# Patient Record
Sex: Male | Born: 1970 | Race: White | Hispanic: No | Marital: Married | Smoking: Never smoker
Health system: Southern US, Community
[De-identification: ages and names within clinical notes are randomized; demographics above are authoritative.]

## PROBLEM LIST (undated history)

## (undated) DIAGNOSIS — J45909 Unspecified asthma, uncomplicated: Secondary | ICD-10-CM

## (undated) DIAGNOSIS — K760 Fatty (change of) liver, not elsewhere classified: Secondary | ICD-10-CM

## (undated) DIAGNOSIS — R519 Headache, unspecified: Secondary | ICD-10-CM

## (undated) DIAGNOSIS — T7840XA Allergy, unspecified, initial encounter: Secondary | ICD-10-CM

## (undated) DIAGNOSIS — I10 Essential (primary) hypertension: Secondary | ICD-10-CM

## (undated) DIAGNOSIS — E785 Hyperlipidemia, unspecified: Secondary | ICD-10-CM

## (undated) DIAGNOSIS — R51 Headache: Secondary | ICD-10-CM

## (undated) HISTORY — DX: Allergy, unspecified, initial encounter: T78.40XA

## (undated) HISTORY — DX: Hyperlipidemia, unspecified: E78.5

## (undated) HISTORY — PX: HERNIA REPAIR: SHX51

---

## 2000-07-29 ENCOUNTER — Encounter: Payer: Self-pay | Admitting: Family Medicine

## 2001-06-20 ENCOUNTER — Emergency Department (HOSPITAL_COMMUNITY): Admission: EM | Admit: 2001-06-20 | Discharge: 2001-06-20 | Payer: Self-pay

## 2004-06-18 ENCOUNTER — Ambulatory Visit: Payer: Self-pay | Admitting: Family Medicine

## 2004-07-24 ENCOUNTER — Ambulatory Visit: Payer: Self-pay | Admitting: Family Medicine

## 2004-07-24 LAB — CONVERTED CEMR LAB: TSH: 1.98 microintl units/mL

## 2004-07-29 ENCOUNTER — Ambulatory Visit: Payer: Self-pay | Admitting: Family Medicine

## 2004-08-13 ENCOUNTER — Ambulatory Visit: Payer: Self-pay | Admitting: Family Medicine

## 2004-08-15 ENCOUNTER — Ambulatory Visit: Payer: Self-pay | Admitting: Orthopedic Surgery

## 2004-08-28 ENCOUNTER — Ambulatory Visit: Payer: Self-pay | Admitting: Family Medicine

## 2004-11-28 ENCOUNTER — Ambulatory Visit: Payer: Self-pay | Admitting: Family Medicine

## 2004-12-26 ENCOUNTER — Ambulatory Visit: Payer: Self-pay | Admitting: Family Medicine

## 2005-01-01 ENCOUNTER — Ambulatory Visit: Payer: Self-pay | Admitting: Family Medicine

## 2005-01-10 ENCOUNTER — Ambulatory Visit: Payer: Self-pay | Admitting: Family Medicine

## 2005-02-24 ENCOUNTER — Ambulatory Visit: Payer: Self-pay | Admitting: Family Medicine

## 2005-06-17 ENCOUNTER — Ambulatory Visit: Payer: Self-pay | Admitting: Family Medicine

## 2005-07-08 ENCOUNTER — Ambulatory Visit: Payer: Self-pay | Admitting: Family Medicine

## 2005-12-04 ENCOUNTER — Ambulatory Visit: Payer: Self-pay | Admitting: Family Medicine

## 2006-08-12 ENCOUNTER — Ambulatory Visit: Payer: Self-pay | Admitting: Family Medicine

## 2006-09-01 ENCOUNTER — Ambulatory Visit: Payer: Self-pay | Admitting: Family Medicine

## 2006-09-28 ENCOUNTER — Ambulatory Visit: Payer: Self-pay | Admitting: Family Medicine

## 2007-01-20 ENCOUNTER — Telehealth (INDEPENDENT_AMBULATORY_CARE_PROVIDER_SITE_OTHER): Payer: Self-pay | Admitting: *Deleted

## 2007-07-20 ENCOUNTER — Ambulatory Visit: Payer: Self-pay | Admitting: Family Medicine

## 2007-08-02 ENCOUNTER — Encounter: Payer: Self-pay | Admitting: Family Medicine

## 2007-08-02 DIAGNOSIS — R740 Nonspecific elevation of levels of transaminase and lactic acid dehydrogenase [LDH]: Secondary | ICD-10-CM

## 2007-08-02 DIAGNOSIS — J309 Allergic rhinitis, unspecified: Secondary | ICD-10-CM

## 2007-08-02 DIAGNOSIS — E781 Pure hyperglyceridemia: Secondary | ICD-10-CM

## 2007-08-19 ENCOUNTER — Ambulatory Visit: Payer: Self-pay | Admitting: Family Medicine

## 2008-02-10 ENCOUNTER — Ambulatory Visit: Payer: Self-pay | Admitting: Family Medicine

## 2008-04-21 ENCOUNTER — Telehealth: Payer: Self-pay | Admitting: Family Medicine

## 2008-06-19 ENCOUNTER — Ambulatory Visit: Payer: Self-pay | Admitting: Family Medicine

## 2008-06-19 LAB — CONVERTED CEMR LAB
AST: 44 units/L — ABNORMAL HIGH (ref 0–37)
Albumin: 3.9 g/dL (ref 3.5–5.2)
Alkaline Phosphatase: 94 units/L (ref 39–117)
BUN: 10 mg/dL (ref 6–23)
CO2: 32 meq/L (ref 19–32)
Chloride: 104 meq/L (ref 96–112)
Eosinophils Relative: 0.9 % (ref 0.0–5.0)
Glucose, Bld: 130 mg/dL — ABNORMAL HIGH (ref 70–99)
HDL: 25.3 mg/dL — ABNORMAL LOW (ref >39.0)
Monocytes Relative: 6.7 % (ref 3.0–12.0)
Neutrophils Relative %: 68.9 % (ref 43.0–77.0)
Platelets: 184 10*3/uL (ref 150–400)
Potassium: 3.7 meq/L (ref 3.5–5.1)
Total CHOL/HDL Ratio: 7.1
Total Protein: 6.8 g/dL (ref 6.0–8.3)
VLDL: 33 mg/dL (ref 0–40)
WBC: 10.8 10*3/uL — ABNORMAL HIGH (ref 4.5–10.5)

## 2008-06-26 ENCOUNTER — Ambulatory Visit: Payer: Self-pay | Admitting: Family Medicine

## 2008-06-26 DIAGNOSIS — E119 Type 2 diabetes mellitus without complications: Secondary | ICD-10-CM

## 2008-07-03 ENCOUNTER — Ambulatory Visit: Payer: Self-pay | Admitting: Family Medicine

## 2008-07-19 ENCOUNTER — Ambulatory Visit: Payer: Self-pay | Admitting: Family Medicine

## 2008-07-19 LAB — CONVERTED CEMR LAB: Glucose, Bld: 120 mg/dL — ABNORMAL HIGH (ref 70–99)

## 2008-07-24 ENCOUNTER — Ambulatory Visit: Payer: Self-pay | Admitting: Family Medicine

## 2008-08-03 ENCOUNTER — Telehealth: Payer: Self-pay | Admitting: Family Medicine

## 2008-08-21 ENCOUNTER — Ambulatory Visit: Payer: Self-pay | Admitting: Family Medicine

## 2008-08-23 ENCOUNTER — Encounter: Payer: Self-pay | Admitting: Family Medicine

## 2008-08-24 ENCOUNTER — Ambulatory Visit: Payer: Self-pay | Admitting: Family Medicine

## 2008-11-28 ENCOUNTER — Ambulatory Visit: Payer: Self-pay | Admitting: Family Medicine

## 2008-11-30 ENCOUNTER — Ambulatory Visit: Payer: Self-pay | Admitting: Family Medicine

## 2008-12-05 ENCOUNTER — Telehealth: Payer: Self-pay | Admitting: Family Medicine

## 2008-12-05 ENCOUNTER — Ambulatory Visit: Payer: Self-pay | Admitting: Family Medicine

## 2008-12-07 ENCOUNTER — Ambulatory Visit: Payer: Self-pay | Admitting: Family Medicine

## 2009-01-12 ENCOUNTER — Encounter: Payer: Self-pay | Admitting: Family Medicine

## 2009-02-28 ENCOUNTER — Ambulatory Visit: Payer: Self-pay | Admitting: Family Medicine

## 2009-03-05 ENCOUNTER — Ambulatory Visit: Payer: Self-pay | Admitting: Family Medicine

## 2009-05-02 ENCOUNTER — Ambulatory Visit: Payer: Self-pay | Admitting: Family Medicine

## 2009-05-28 ENCOUNTER — Ambulatory Visit: Payer: Self-pay | Admitting: Family Medicine

## 2009-05-28 ENCOUNTER — Encounter: Admission: RE | Admit: 2009-05-28 | Discharge: 2009-05-28 | Payer: Self-pay | Admitting: Family Medicine

## 2009-05-28 DIAGNOSIS — M25579 Pain in unspecified ankle and joints of unspecified foot: Secondary | ICD-10-CM | POA: Insufficient documentation

## 2009-05-29 ENCOUNTER — Telehealth: Payer: Self-pay | Admitting: Family Medicine

## 2009-06-20 ENCOUNTER — Ambulatory Visit: Payer: Self-pay | Admitting: Family Medicine

## 2009-06-20 DIAGNOSIS — S92253A Displaced fracture of navicular [scaphoid] of unspecified foot, initial encounter for closed fracture: Secondary | ICD-10-CM | POA: Insufficient documentation

## 2009-06-26 ENCOUNTER — Ambulatory Visit: Payer: Self-pay | Admitting: Family Medicine

## 2009-06-26 LAB — CONVERTED CEMR LAB
AST: 28 units/L (ref 0–37)
Albumin: 4 g/dL (ref 3.5–5.2)
Alkaline Phosphatase: 79 units/L (ref 39–117)
Basophils Relative: 0.4 % (ref 0.0–3.0)
CO2: 30 meq/L (ref 19–32)
Creatinine,U: 170.4 mg/dL
Eosinophils Relative: 1.8 % (ref 0.0–5.0)
GFR calc non Af Amer: 72.09 mL/min (ref >60)
Glucose, Bld: 103 mg/dL — ABNORMAL HIGH (ref 70–99)
Hgb A1c MFr Bld: 6.1 % (ref 4.6–6.5)
Lymphocytes Relative: 32.1 % (ref 12.0–46.0)
MCV: 90.4 fL (ref 78.0–100.0)
Microalb Creat Ratio: 0.6 mg/g (ref 0.0–30.0)
Microalb, Ur: 0.1 mg/dL (ref 0.0–1.9)
Monocytes Relative: 9.1 % (ref 3.0–12.0)
Neutrophils Relative %: 56.6 % (ref 43.0–77.0)
Potassium: 3.8 meq/L (ref 3.5–5.1)
RBC: 4.84 M/uL (ref 4.22–5.81)
Sodium: 142 meq/L (ref 135–145)
Total CHOL/HDL Ratio: 6
VLDL: 29 mg/dL (ref 0.0–40.0)
WBC: 8 10*3/uL (ref 4.5–10.5)

## 2009-07-09 ENCOUNTER — Ambulatory Visit: Payer: Self-pay | Admitting: Family Medicine

## 2009-08-20 ENCOUNTER — Ambulatory Visit: Payer: Self-pay | Admitting: Family Medicine

## 2009-08-20 LAB — CONVERTED CEMR LAB: ALT: 29 units/L (ref 0–53)

## 2009-09-28 ENCOUNTER — Ambulatory Visit: Payer: Self-pay | Admitting: Family Medicine

## 2009-09-28 DIAGNOSIS — J018 Other acute sinusitis: Secondary | ICD-10-CM

## 2009-10-04 ENCOUNTER — Ambulatory Visit: Payer: Self-pay | Admitting: Family Medicine

## 2009-10-04 LAB — CONVERTED CEMR LAB
ALT: 25 units/L (ref 0–53)
Total CHOL/HDL Ratio: 4

## 2009-10-10 ENCOUNTER — Ambulatory Visit: Payer: Self-pay | Admitting: Family Medicine

## 2009-11-27 ENCOUNTER — Encounter (INDEPENDENT_AMBULATORY_CARE_PROVIDER_SITE_OTHER): Payer: Self-pay | Admitting: *Deleted

## 2010-02-12 ENCOUNTER — Encounter (INDEPENDENT_AMBULATORY_CARE_PROVIDER_SITE_OTHER): Payer: Self-pay | Admitting: *Deleted

## 2010-05-03 ENCOUNTER — Encounter: Payer: Self-pay | Admitting: Family Medicine

## 2010-05-22 ENCOUNTER — Ambulatory Visit: Payer: Self-pay | Admitting: Family Medicine

## 2010-07-15 ENCOUNTER — Ambulatory Visit: Admit: 2010-07-15 | Payer: Self-pay | Admitting: Family Medicine

## 2010-07-16 ENCOUNTER — Ambulatory Visit
Admission: RE | Admit: 2010-07-16 | Discharge: 2010-07-16 | Payer: Self-pay | Source: Home / Self Care | Attending: Family Medicine | Admitting: Family Medicine

## 2010-08-06 NOTE — Assessment & Plan Note (Signed)
Summary: ST,CONGESTION,COUGH,EAR/CLE   Vital Signs:  Patient profile:   40 year old male Height:      67 inches Weight:      217.50 pounds BMI:     34.19 Temp:     98 degrees F oral Pulse rate:   84 / minute Pulse rhythm:   regular BP sitting:   102 / 72  (left arm) Cuff size:   large  Vitals Entered By: Delilah Shan CMA Duncan Dull) (September 28, 2009 10:41 AM) CC: ST, congestion, cough   History of Present Illness: 40 yo with 1 week of worsening URI symptoms. Started with runnny nose, sinus pressure. Now has producitive cough. NO wheezing or shortness of breath. Throat is sore and ears are popping. No fevers or chills. Taking Mucinex with mild relief of symptoms.  Current Medications (verified): 1)  Allegra 180 Mg  Tabs (Fexofenadine Hcl) .... One Tab By Mouth Once Daily As Needed 2)  Flonase 50 Mcg/act  Susp (Fluticasone Propionate) .... One Inhalation Each Nostril Once Daily As Needed 3)  Vitamin C .... Daily 4)  Metformin Hcl 500 Mg Tabs (Metformin Hcl) .... One Tab By Mouth At [facility] 5)  Pravachol 20 Mg Tabs (Pravastatin Sodium) .... One Tab By Mouth At Night. 6)  Azithromycin 250 Mg  Tabs (Azithromycin) .... 2 By  Mouth Today and Then 1 Daily For 4 Days  Allergies: 1)  ! Pcn  Review of Systems      See HPI General:  Denies chills and fever. ENT:  Complains of earache, nasal congestion, sinus pressure, and sore throat; denies ear discharge. Resp:  Complains of cough and sputum productive; denies shortness of breath and wheezing.  Physical Exam  General:  Well-developed,well-nourished,in no acute distress; alert,appropriate and cooperative throughout examination Ears:  TMs retracted bilaterally. Nose:  nasal dischargemucosal pallor.   Mouth:  pharyngeal erythema.  no exudates. Lungs:  Normal respiratory effort, chest expands symmetrically. Lungs are clear to auscultation, no crackles or wheezes. Heart:  Normal rate and regular rhythm. S1 and S2 normal without gallop,  murmur, click, rub or other extra sounds. Extremities:  No clubbing, cyanosis, edema, or deformity noted with normal full range of motion of all joints.   Psych:  Cognition and judgment appear intact. Alert and cooperative with normal attention span and concentration. No apparent delusions, illusions, hallucinations   Impression & Recommendations:  Problem # 1:  OTHER ACUTE SINUSITIS (ICD-461.8) Assessment New Given duration of symptoms along with progression, will treat with zpack (allergic to PCN). Continue supportive care with Ibuprofen and Mucinex. His updated medication list for this problem includes:    Flonase 50 Mcg/act Susp (Fluticasone propionate) ..... One inhalation each nostril once daily as needed    Azithromycin 250 Mg Tabs (Azithromycin) .Marland Kitchen... 2 by  mouth today and then 1 daily for 4 days  Complete Medication List: 1)  Allegra 180 Mg Tabs (Fexofenadine hcl) .... One tab by mouth once daily as needed 2)  Flonase 50 Mcg/act Susp (Fluticasone propionate) .... One inhalation each nostril once daily as needed 3)  Vitamin C  .... Daily 4)  Metformin Hcl 500 Mg Tabs (Metformin hcl) .... One tab by mouth at nite 5)  Pravachol 20 Mg Tabs (Pravastatin sodium) .... One tab by mouth at night. 6)  Azithromycin 250 Mg Tabs (Azithromycin) .... 2 by  mouth today and then 1 daily for 4 days Prescriptions: AZITHROMYCIN 250 MG  TABS (AZITHROMYCIN) 2 by  mouth today and then 1 daily for 4  days  #6 x 0   Entered and Authorized by:   Ruthe Mannan MD   Signed by:   Ruthe Mannan MD on 09/28/2009   Method used:   [facility]        [facility]  [address] (retail)       [address]       [place], [place]  [address]       Ph: [phone] or [phone]       Fax: [phone]   RxID:   [id number]   Current Allergies (reviewed today): ! PCN

## 2010-08-06 NOTE — Letter (Signed)
Summary: Nadara Eaton letter  [place] at [facility]  [address] [place], [place] [address]   Phone: [phone]  Fax: [phone]       02/12/2010 MRN: [id number]  ELZA SORTOR [address] [place], [place]  [address]  Dear Mr. [facility] [place] and [place] announce the retirement of Arta Silence, M.D., from full-time practice at the [facility] office effective January 03, 2010 and his plans of returning part-time.  It is important to Dr. Hetty Ely and to our practice that you understand that [facility] has seven physicians in our office for your health care needs.  We will continue to offer the same exceptional care that you have today.    Dr. Hetty Ely has spoken to many of you about his plans for retirement and returning part-time in the fall.   We will continue to work with you through the transition to schedule appointments for you in the office and meet the high standards that [place] is committed to.   Again, it is with great pleasure that we share the news that Dr. Hetty Ely will return to [facility] at [facility] in October of 2011 with a reduced schedule.    If you have any questions, or would like to request an appointment with one of our physicians, please call us at [phone] and press the option for Scheduling an appointment.  We take pleasure in providing you with excellent patient care and look forward to seeing you at your next office visit.  Our [facility] Physicians are:  Tillman Abide, M.D. Laurita Quint, M.D. Roxy Manns, M.D. Kerby Nora, M.D. Hannah Beat, M.D. Ruthe Mannan, M.D. We proudly welcomed Raechel Ache, M.D. and Eustaquio Boyden, M.D. to the practice in July/August 2011.  Sincerely,  [facility]

## 2010-08-06 NOTE — Assessment & Plan Note (Signed)
Summary: cold symptoms/alc   Vital Signs:  Patient profile:   40 year old male Weight:      225 pounds Temp:     97.1 degrees F oral Pulse rate:   68 / minute Pulse rhythm:   regular BP sitting:   110 / 74  (left arm) Cuff size:   large  Vitals Entered By: Sydell Axon LPN (May 22, 2010 11:21 AM) CC: Cold symptoms, head congestion/green to clear and cough X 2 weeks   History of Present Illness: Pt here for congestive sxs. His sugar control has been good. He has not seen a bump in his sugar with this episode of congestion. He has had no headache, he had fever the third day of 99.2 which has resolved, some ear pressure bilat, copiuos rhinitis green in the Am which clears, ST which he continues to have, some cough worse at night, nonproductive, no nausea or vomiting.  He has taken guaifenesin AM and lunch.  Problems Prior to Update: 1)  Other Acute Sinusitis  (ICD-461.8) 2)  Closed Fracture of Navicular Bone of Foot  (ICD-825.22) 3)  Ankle Pain, Left  (ICD-719.47) 4)  Herpes Zoster, L Supraperiorbital Area  (ICD-053.9) 5)  Diabetes Mellitus  (ICD-250.00) 6)  Health Maintenance Exam  (ICD-V70.0) 7)  Allergic Rhinitis  (ICD-477.9) 8)  Hypertriglyceridemia  (ICD-272.1) 9)  Nonspec Elevation of Levels of Transaminase/ldh  (ICD-790.4)  Medications Prior to Update: 1)  Allegra 180 Mg  Tabs (Fexofenadine Hcl) .... One Tab By Mouth Once Daily As Needed 2)  Flonase 50 Mcg/act  Susp (Fluticasone Propionate) .... One Inhalation Each Nostril Once Daily As Needed 3)  Vitamin C .... Daily 4)  Metformin Hcl 500 Mg Tabs (Metformin Hcl) .... One Tab By Mouth At [facility] 5)  Pravachol 20 Mg Tabs (Pravastatin Sodium) .... 1/2  Tab By Mouth At Night. 6)  Onetouch Test  Strp (Glucose Blood) .... Check Blood Sugar Once A Day 7)  Onetouch Lancets  Misc (Lancets) .... Check Blood Sugar Once A Day  Current Medications (verified): 1)  Allegra 180 Mg  Tabs (Fexofenadine Hcl) .... One Tab By Mouth Once  Daily As Needed 2)  Flonase 50 Mcg/act  Susp (Fluticasone Propionate) .... One Inhalation Each Nostril Once Daily As Needed 3)  Metformin Hcl 500 Mg Tabs (Metformin Hcl) .... One Tab By Mouth At [facility] 4)  Pravachol 20 Mg Tabs (Pravastatin Sodium) .... 1/2  Tab By Mouth At Night. 5)  Onetouch Test  Strp (Glucose Blood) .... Check Blood Sugar Once A Day 6)  Onetouch Lancets  Misc (Lancets) .... Check Blood Sugar Once A Day 7)  Vitamin C 500 Mg Tabs (Ascorbic Acid) .... Take One By Mouth Daily 8)  Ery-Tab 333 Mg Tbec (Erythromycin Base) .... One Tab By Mouth Three Times A Day  Allergies: 1)  ! Pcn  Physical Exam  General:  Well-developed,well-nourished,in no acute distress; alert,appropriate and cooperative throughout examination, mildly overweigh, minimally congested. Head:  Normocephalic and atraumatic without obvious abnormalities. No apparent alopecia or balding. Sinuses NT. Eyes:  Conjunctiva clear bilaterally.  Ears:  External ear exam shows no significant lesions or deformities.  Otoscopic examination reveals clear canals, tympanic membranes are intact bilaterally without bulging, retraction, inflammation or discharge. Hearing is grossly normal bilaterally. Nose:  No nasal discharge, mild mucosal pallor.   Mouth:  pharyngeal erythema.  no exudates, mild PND. Neck:  No deformities, masses, or tenderness noted. Lungs:  Normal respiratory effort, chest expands symmetrically. Lungs with slight crackles  in the right lung base which does not clear with cough. Heart:  Normal rate and regular rhythm. S1 and S2 normal without gallop, murmur, click, rub or other extra sounds.   Impression & Recommendations:  Problem # 1:  BRONCHITIS- ACUTE (ICD-466.0) Assessment New See instructions. His updated medication list for this problem includes:    Ery-tab 333 Mg Tbec (Erythromycin base) ..... One tab by mouth three times a day  Complete Medication List: 1)  Allegra 180 Mg Tabs (Fexofenadine hcl)  .... One tab by mouth once daily as needed 2)  Flonase 50 Mcg/act Susp (Fluticasone propionate) .... One inhalation each nostril once daily as needed 3)  Metformin Hcl 500 Mg Tabs (Metformin hcl) .... One tab by mouth at nite 4)  Pravachol 20 Mg Tabs (Pravastatin sodium) .... 1/2  tab by mouth at night. 5)  Onetouch Test Strp (Glucose blood) .... Check blood sugar once a day 6)  Onetouch Lancets Misc (Lancets) .... Check blood sugar once a day 7)  Vitamin C 500 Mg Tabs (Ascorbic acid) .... Take one by mouth daily 8)  Ery-tab 333 Mg Tbec (Erythromycin base) .... One tab by mouth three times a day  Patient Instructions: 1)  Take Emycin. 2)  Cont Guaif. 3)  Use Delsym at nite per label.  4)  Call if no impr or worsenining. Prescriptions: ERY-TAB 333 MG TBEC (ERYTHROMYCIN BASE) one tab by mouth three times a day  #30 x 0   Entered and Authorized by:   Shaune Leeks MD   Signed by:   Shaune Leeks MD on 05/22/2010   Method used:   [facility]        [facility]  [id number] ([facility]       [address] [place], [place]  [address]       Ph: [phone] or [phone]       Fax: [phone]   RxID:   [id number]    Orders Added: 1)  Est. Patient Level III [[address]]    Current Allergies (reviewed today): ! PCN

## 2010-08-06 NOTE — Consult Note (Signed)
Summary: Dr.William Porfilio,[facility],Note  Dr.William Porfilio,[facility],Note   Imported By: Beau Fanny 05/08/2010 16:50:23  _____________________________________________________________________  External Attachment:    Type:   Image     Comment:   External Document  Appended Document: Dr.William Porfilio,[facility],Note     Clinical Lists Changes  Observations: Added new observation of DMEYEEXAMNXT: 05/2011 (05/08/2010 17:45) Added new observation of DMEYEEXMRES: normal (05/03/2010 17:45) Added new observation of EYE EXAM BY: Dr Druscilla Brownie (05/03/2010 17:45) Added new observation of DIAB EYE EX: normal (05/03/2010 17:45)        Diabetes Management Exam:    Eye Exam:       Eye Exam done elsewhere          Date: 05/03/2010          Results: normal          Done by: Dr Druscilla Brownie

## 2010-08-06 NOTE — Assessment & Plan Note (Signed)
Summary: 3 month follow up/rbh   Vital Signs:  Patient profile:   40 year old male Weight:      219.50 pounds Temp:     98.3 degrees F oral Pulse rate:   64 / minute Pulse rhythm:   regular BP sitting:   108 / 70  (left arm) Cuff size:   large  Vitals Entered By: Sydell Axon LPN (October 10, 1608 11:58 AM) CC: 3 Month follow-up   History of Present Illness: Pt here for followup asfter starting on Pravachol as diabetic for plaque stabilization with chol profile that was not that bad, LDL 91, Trig 145 but HDL 25. He continues to watch his intake and check his sugar carefully. He checks in a three day progression, nos typically 90=-100s, nighttime sometimes 140-160 but he then relates he realizes sometimes those are done within 2 hrs or eating supper due to getting home late and going to bed early! He has no complaints and feels well.  Problems Prior to Update: 1)  Other Acute Sinusitis  (ICD-461.8) 2)  Closed Fracture of Navicular Bone of Foot  (ICD-825.22) 3)  Ankle Pain, Left  (ICD-719.47) 4)  Herpes Zoster, L Supraperiorbital Area  (ICD-053.9) 5)  Diabetes Mellitus  (ICD-250.00) 6)  Health Maintenance Exam  (ICD-V70.0) 7)  Allergic Rhinitis  (ICD-477.9) 8)  Hypertriglyceridemia  (ICD-272.1) 9)  Nonspec Elevation of Levels of Transaminase/ldh  (ICD-790.4)  Medications Prior to Update: 1)  Allegra 180 Mg  Tabs (Fexofenadine Hcl) .... One Tab By Mouth Once Daily As Needed 2)  Flonase 50 Mcg/act  Susp (Fluticasone Propionate) .... One Inhalation Each Nostril Once Daily As Needed 3)  Vitamin C .... Daily 4)  Metformin Hcl 500 Mg Tabs (Metformin Hcl) .... One Tab By Mouth At [facility] 5)  Pravachol 20 Mg Tabs (Pravastatin Sodium) .... One Tab By Mouth At Night.  Allergies: 1)  ! Pcn  Physical Exam  General:  Well-developed,well-nourished,in no acute distress; alert,appropriate and cooperative throughout examination, mildly overweight. Head:  Normocephalic and atraumatic without  obvious abnormalities. No apparent alopecia or balding. Eyes:  Conjunctiva clear bilaterally.  Ears:  TMs retracted bilaterally. Nose:  nasal dischargemucosal pallor.   Mouth:  pharyngeal erythema.  no exudates. Neck:  No deformities, masses, or tenderness noted. Lungs:  Normal respiratory effort, chest expands symmetrically. Lungs are clear to auscultation, no crackles or wheezes. Heart:  Normal rate and regular rhythm. S1 and S2 normal without gallop, murmur, click, rub or other extra sounds.   Impression & Recommendations:  Problem # 1:  HYPERTRIGLYCERIDEMIA (ICD-272.1) Assessment Improved Excellent nos except for HDL. LDL so low, try to go to 1/2 tab at night. His updated medication list for this problem includes:    Pravachol 20 Mg Tabs (Pravastatin sodium) .Marland Kitchen... 1/2  tab by mouth at night.  Labs Reviewed: SGOT: 18 (10/04/2009)   SGPT: 25 (10/04/2009)   HDL:25.20 (10/04/2009), 25.30 (06/26/2009)  LDL:47 (10/04/2009), 91 (06/26/2009)  Chol:98 (10/04/2009), 145 (06/26/2009)  Trig:130.0 (10/04/2009), 145.0 (06/26/2009)  Problem # 2:  DIABETES MELLITUS (ICD-250.00) Diary of nos great, most nos in 4 day progression 90-100s, nightime sometimes 140-160....but sometimes within 2 hrs of supper. Cont curr routine and meds. His updated medication list for this problem includes:    Metformin Hcl 500 Mg Tabs (Metformin hcl) ..... One tab by mouth at nite  Complete Medication List: 1)  Allegra 180 Mg Tabs (Fexofenadine hcl) .... One tab by mouth once daily as needed 2)  Flonase 50 Mcg/act Susp (  Fluticasone propionate) .... One inhalation each nostril once daily as needed 3)  Vitamin C  .... Daily 4)  Metformin Hcl 500 Mg Tabs (Metformin hcl) .... One tab by mouth at nite 5)  Pravachol 20 Mg Tabs (Pravastatin sodium) .... 1/2  tab by mouth at night.  Patient Instructions: 1)  RTC in the Fall for recheck, add ACEI then recheck LDL. 2)  25 mins spent with pt.  Current Allergies (reviewed  today): ! PCN

## 2010-08-06 NOTE — Assessment & Plan Note (Signed)
Summary: CPX/BIR   Vital Signs:  Patient profile:   40 year old male Weight:      214.75 pounds BMI:     33.76 Temp:     98.5 degrees F oral Pulse rate:   72 / minute Pulse rhythm:   regular BP sitting:   102 / 70  (left arm) Cuff size:   regular  Vitals Entered By: Linde Gillis CMA Duncan Dull) 08-05-2009 2:02 PM) CC: 30 minute exam   History of Present Illness: Pt here for Comp Exam, has a cold. Just got over a navicular fracture.  Has Cough of green mucous. ST gonem, no fever or chills. Sugars avg 80-130, lowest 70...highest 175.  He still sees no real patterns.  Preventive Screening-Counseling & Management  Alcohol-Tobacco     Alcohol drinks/day: <1     Alcohol type: gin rarely     Smoking Status: never     Passive Smoke Exposure: no  Caffeine-Diet-Exercise     Caffeine use/day: 2     Does Patient Exercise: no  Problems Prior to Update: 1)  Closed Fracture of Navicular Bone of Foot  (ICD-825.22) 2)  Ankle Pain, Left  (ICD-719.47) 3)  Herpes Zoster, L Supraperiorbital Area  (ICD-053.9) 4)  Diabetes Mellitus  (ICD-250.00) 5)  Health Maintenance Exam  (ICD-V70.0) 6)  Allergic Rhinitis  (ICD-477.9) 7)  Hypertriglyceridemia  (ICD-272.1) 8)  Nonspec Elevation of Levels of Transaminase/ldh  (ICD-790.4)  Medications Prior to Update: 1)  Allegra 180 Mg  Tabs (Fexofenadine Hcl) .... One Tab By Mouth Once Daily As Needed 2)  Flonase 50 Mcg/act  Susp (Fluticasone Propionate) .... One Inhalation Each Nostril Once Daily As Needed 3)  Vitamin C .... Daily 4)  Metformin Hcl 500 Mg Tabs (Metformin Hcl) .... One Tab By Mouth At [facility]  Allergies: 1)  ! Pcn  Past History:  Past Medical History: Last updated: 08/02/2007 Allergic rhinitis:(11/2000)  Past Surgical History: Last updated: 08/02/2007 BILAT HERNIA REPAIR  19 MOS  ULTRASOUND OF ABDOMEN ; NORMAL:(09/03/2000) L MIDDLE FINGER LACERATION 2004  Family History: Last updated: 2009-08-05 Father: DECEASED 73   MI ,  DM; ELEVATED CHOLESTEROL; HTN (SMOKER) Mother: DECEASED 17  COPD(SMOKER) ; BREAST CANCER MATAST: SISTER 40 Off dairy, s/p gallbladder MOTHER DECEASED MI; + FATHER MI HBP: + FATHER DM: + FATHER; MGF GOUT/ARTHRITIS: PROSTATE CANCER:  BREAST CANCER: + MOTHER // ? AUNT MYELODYSP; COLON CANCER: DEPRESSION: NEGTIVE ETOH/DRUG ABUSE: NEGATIVE OTHER: NEGATIVE STROKE  Social History: Last updated: 08-05-2009 Marital Status: Married LIVES WITH WIFE Children: Daughter 02/26/2009 Occupation: [employer]ING [employer] --[employer]  Risk Factors: Alcohol Use: <1 (08/05/09) Caffeine Use: 2 (August 05, 2009) Exercise: no (08-05-09)  Risk Factors: Smoking Status: never (2009-08-05) Passive Smoke Exposure: no (08/05/2009)  Family History: Father: DECEASED 43   MI , DM; ELEVATED CHOLESTEROL; HTN (SMOKER) Mother: DECEASED 74  COPD(SMOKER) ; BREAST CANCER MATAST: SISTER 40 Off dairy, s/p gallbladder MOTHER DECEASED MI; + FATHER MI HBP: + FATHER DM: + FATHER; MGF GOUT/ARTHRITIS: PROSTATE CANCER:  BREAST CANCER: + MOTHER // ? AUNT MYELODYSP; COLON CANCER: DEPRESSION: NEGTIVE ETOH/DRUG ABUSE: NEGATIVE OTHER: NEGATIVE STROKE  Social History: Marital Status: Married LIVES WITH WIFE Children: Daughter 02/26/2009 Occupation: [employer] --[employer]  Review of Systems General:  Denies chills, fatigue, fever, sweats, weakness, and weight loss. Eyes:  Denies blurring, discharge, and eye pain. ENT:  Denies decreased hearing, ear discharge, earache, and ringing in ears. CV:  Denies chest pain or discomfort, fainting, fatigue, palpitations, and shortness of breath with  exertion. Resp:  Denies cough, shortness of breath, and wheezing. GI:  Denies abdominal pain, bloody stools, change in bowel habits, constipation, dark tarry stools, diarrhea, indigestion, loss of appetite, nausea, vomiting, vomiting blood, and yellowish skin color. GU:  Denies dysuria, hematuria, incontinence, nocturia, and  urinary frequency. MS:  Complains of joint pain; denies joint redness, joint swelling, low back pain, muscle aches, and cramps; ankle, left. Derm:  Denies dryness, flushing, itching, and rash. Neuro:  Denies disturbances in coordination, falling down, headaches, numbness, poor balance, tingling, and tremors.  Physical Exam  General:  Well-developed,well-nourished,in no acute distress; alert,appropriate and cooperative throughout examination Head:  Normocephalic and atraumatic without obvious abnormalities. No apparent alopecia or balding. Eyes:  Conjunctiva clear bilaterally.  Ears:  External ear exam shows no significant lesions or deformities.  Otoscopic examination reveals clear canals, tympanic membranes are intact bilaterally without bulging, retraction, inflammation or discharge. Hearing is grossly normal bilaterally. Nose:  External nasal examination shows no deformity or inflammation. Nasal mucosa are pink and moist without lesions or exudates. Mouth:  Oral mucosa and oropharynx without lesions or exudates.  Teeth in good repair. Neck:  No deformities, masses, or tenderness noted. Chest Wall:  No deformities, masses, tenderness or gynecomastia noted. Breasts:  No masses or gynecomastia noted Lungs:  Normal respiratory effort, chest expands symmetrically. Lungs are clear to auscultation, no crackles or wheezes. Heart:  Normal rate and regular rhythm. S1 and S2 normal without gallop, murmur, click, rub or other extra sounds. Abdomen:  Bowel sounds positive,abdomen soft and non-tender without masses, organomegaly or hernias noted. Rectal:  No external abnormalities noted. Normal sphincter tone. No rectal masses or tenderness. G neg. Genitalia:  Testes bilaterally descended without nodularity, tenderness or masses. No scrotal masses or lesions. No penis lesions or urethral discharge. Prostate:  Prostate gland firm and smooth, no enlargement, nodularity, tenderness, mass, asymmetry or  induration. 10gms. Msk:  No deformity or scoliosis noted of thoracic or lumbar spine.  Gait back to nml. Pulses:  R and L carotid,radial,femoral,dorsalis pedis and posterior tibial pulses are full and equal bilaterally Extremities:  No clubbing, cyanosis, edema, or deformity noted with normal full range of motion of all joints.   Neurologic:  No cranial nerve deficits noted. Station and gait are normal. Plantar reflexes are down-going bilaterally. DTRs are symmetrical throughout. Sensory, motor and coordinative functions appear intact. Skin:  Intact without suspicious lesions or rashes Cervical Nodes:  No lymphadenopathy noted Inguinal Nodes:  No significant adenopathy Psych:  Cognition and judgment appear intact. Alert and cooperative with normal attention span and concentration. No apparent delusions, illusions, hallucinations  Diabetes Management Exam:    Foot Exam (with socks and/or shoes not present):       Sensory-Pinprick/Light touch:          Left medial foot (L-4): normal          Left dorsal foot (L-5): normal          Left lateral foot (S-1): normal          Right medial foot (L-4): normal          Right dorsal foot (L-5): normal          Right lateral foot (S-1): normal       Sensory-Monofilament:          Left foot: normal          Right foot: normal       Inspection:  Left foot: normal          Right foot: normal       Nails:          Left foot: normal          Right foot: normal   Impression & Recommendations:  Problem # 1:  HEALTH MAINTENANCE EXAM (ICD-V70.0) Assessment Comment Only  Problem # 2:  ANKLE PAIN, LEFT (ICD-719.47) Assessment: Improved Better but still sore. Is in regular shoes and gait close to baseline.  Problem # 3:  HERPES ZOSTER, L SUPRAPERIORBITAL AREA (ICD-053.9) Assessment: Improved Resolved. Discussed Zostavax at 61yoa.  Problem # 4:  HYPERTRIGLYCERIDEMIA (ICD-272.1) HDL low, encouraged exercise and always watchdiet to try to  get LDL lower. Start Pravachol for stabilization with DM. His updated medication list for this problem includes:    Pravachol 20 Mg Tabs (Pravastatin sodium) ..... One tab by mouth at night.  Labs Reviewed: SGOT: 28 (06/26/2009)   SGPT: 40 (06/26/2009)   HDL:25.30 (06/26/2009), 25.3 (06/19/2008)  LDL:91 (06/26/2009), 121 (32/35/5732)  Chol:145 (06/26/2009), 180 (06/19/2008)  Trig:145.0 (06/26/2009), 167 (06/19/2008)  Problem # 5:  NONSPEC ELEVATION OF LEVELS OF TRANSAMINASE/LDH (ICD-790.4) Assessment: Improved BVack to nml for now.  Problem # 6:  DIABETES MELLITUS (ICD-250.00) Assessment: Unchanged OK control but early in the game so watch carefully and start exercising. His updated medication list for this problem includes:    Metformin Hcl 500 Mg Tabs (Metformin hcl) ..... One tab by mouth at nite  Labs Reviewed: Creat: 1.2 (06/26/2009)    Reviewed HgBA1c results: 6.1 (06/26/2009)  5.9 (02/28/2009)  Complete Medication List: 1)  Allegra 180 Mg Tabs (Fexofenadine hcl) .... One tab by mouth once daily as needed 2)  Flonase 50 Mcg/act Susp (Fluticasone propionate) .... One inhalation each nostril once daily as needed 3)  Vitamin C  .... Daily 4)  Metformin Hcl 500 Mg Tabs (Metformin hcl) .... One tab by mouth at nite 5)  Pravachol 20 Mg Tabs (Pravastatin sodium) .... One tab by mouth at night.  Patient Instructions: 1)  SGOT, SGPT 272.1    6 WEEKS 2)  See me in 3 mos, SGOT, SGPT, CHOL PROFILE  272.1     prior  Prescriptions: METFORMIN HCL 500 MG TABS (METFORMIN HCL) one tab by mouth at nite  #90 x 3   Entered and Authorized by:   Shaune Leeks MD   Signed by:   Shaune Leeks MD on 07/09/2009   Method used:   [facility]ron[facility] to        [facility] [phone]* (retail)       [address] [place], [place]  [address]       Ph: [phone] or [phone]       Fax: [phone]   RxID:   [id number] FLONASE 50 MCG/ACT  SUSP (FLUTICASONE  PROPIONATE) one inhalation each nostril once daily as needed  #3 MDI x 3   Entered and Authorized by:   Shaune Leeks MD   Signed by:   Shaune Leeks MD on 07/09/2009   Method used:   Electronically to        [facility] [phone]* (retail)       [address]       [place], [place]  [address]       Ph: [phone] or  [phone]       Fax: [phone]   RxID:   [id number] ALLEGRA 180 MG  TABS (FEXOFENADINE HCL) one tab by mouth once daily as needed  #90 x 4   Entered and Authorized by:   Shaune Leeks MD   Signed by:   Shaune Leeks MD on 07/09/2009   Method used:   Electronically to        [facility] [phone]* (retail)       [address] [place], [place]  [address]       Ph: [phone] or [phone]       Fax: [phone]   RxID:   [id number] PRAVACHOL 20 MG TABS (PRAVASTATIN SODIUM) one tab by mouth at night.  #90 x 3   Entered and Authorized by:   Shaune Leeks MD   Signed by:   Shaune Leeks MD on 07/09/2009   Method used:   Electronically to        [facility] [phone]* (retail)       [address] [place], [place]  [address]       Ph: [phone] or [phone]       Fax: [phone]   RxID:   [id number]   Current Allergies (reviewed today): ! PCN  Appended Document: CPX/BIR Results reported and discussed via phone tree. Cont Prava.

## 2010-08-06 NOTE — Miscellaneous (Signed)
Summary: med list update  Medications Added ONETOUCH TEST  STRP (GLUCOSE BLOOD) Check blood sugar once a day ONETOUCH LANCETS  MISC (LANCETS) check blood sugar once a day       Clinical Lists Changes  Medications: Added new medication of ONETOUCH TEST  STRP (GLUCOSE BLOOD) Check blood sugar once a day Added new medication of ONETOUCH LANCETS  MISC (LANCETS) check blood sugar once a day     Prior Medications: ALLEGRA 180 MG  TABS (FEXOFENADINE HCL) one tab by mouth once daily as needed FLONASE 50 MCG/ACT  SUSP (FLUTICASONE PROPIONATE) one inhalation each nostril once daily as needed VITAMIN C () Daily METFORMIN HCL 500 MG TABS (METFORMIN HCL) one tab by mouth at nite PRAVACHOL 20 MG TABS (PRAVASTATIN SODIUM) 1/2  tab by mouth at night. ONETOUCH TEST  STRP (GLUCOSE BLOOD) Check blood sugar once a day ONETOUCH LANCETS  MISC (LANCETS) check blood sugar once a day Current Allergies: ! PCN

## 2010-08-08 NOTE — Assessment & Plan Note (Signed)
Summary: ? head congestion, cough   Vital Signs:  Patient profile:   40 year old male Height:      67 inches Weight:      224 pounds BMI:     35.21 Temp:     97.7 degrees F oral Pulse rate:   67 / minute Pulse rhythm:   regular BP sitting:   130 / 90  (right arm) Cuff size:   large  Vitals Entered By: Linde Gillis CMA Duncan Dull) (July 16, 2010 11:49 AM) CC: head congestion, cough   History of Present Illness: 40 yo with 10 days  of worsening URI symptoms. Started with runnny nose, sinus pressure. Now left ear very painful, feels full.  No drainage, not hearing as well.  NO wheezing or shortness of breath. Throat is sore and ears are popping. No fevers or chills.  Taking Mucinex with mild relief of symptoms.  Current Medications (verified): 1)  Allegra 180 Mg  Tabs (Fexofenadine Hcl) .... One Tab By Mouth Once Daily As Needed 2)  Flonase 50 Mcg/act  Susp (Fluticasone Propionate) .... One Inhalation Each Nostril Once Daily As Needed 3)  Metformin Hcl 500 Mg Tabs (Metformin Hcl) .... One Tab By Mouth At [facility] 4)  Pravachol 20 Mg Tabs (Pravastatin Sodium) .... 1/2  Tab By Mouth At Night. 5)  Onetouch Test  Strp (Glucose Blood) .... Check Blood Sugar Once A Day 6)  Onetouch Lancets  Misc (Lancets) .... Check Blood Sugar Once A Day 7)  Vitamin C 500 Mg Tabs (Ascorbic Acid) .... Take One By Mouth Daily 8)  Azithromycin 250 Mg  Tabs (Azithromycin) .... 2 By  Mouth Today and Then 1 Daily For 4 Days  Allergies: 1)  ! Pcn  Past History:  Past Medical History: Last updated: 08/02/2007 Allergic rhinitis:(11/2000)  Past Surgical History: Last updated: 08/02/2007 BILAT HERNIA REPAIR  19 MOS  ULTRASOUND OF ABDOMEN ; NORMAL:(09/03/2000) L MIDDLE FINGER LACERATION 2004  Family History: Last updated: 07-13-2009 Father: DECEASED 87   MI , DM; ELEVATED CHOLESTEROL; HTN (SMOKER) Mother: DECEASED 9  COPD(SMOKER) ; BREAST CANCER MATAST: SISTER 40 Off dairy, s/p gallbladder MOTHER  DECEASED MI; + FATHER MI HBP: + FATHER DM: + FATHER; MGF GOUT/ARTHRITIS: PROSTATE CANCER:  BREAST CANCER: + MOTHER // ? AUNT MYELODYSP; COLON CANCER: DEPRESSION: NEGTIVE ETOH/DRUG ABUSE: NEGATIVE OTHER: NEGATIVE STROKE  Social History: Last updated: July 13, 2009 Marital Status: Married LIVES WITH WIFE Children: Daughter 02/26/2009 Occupation: [employer]  Risk Factors: Alcohol Use: <1 (13-Jul-2009) Caffeine Use: 2 (07/13/09) Exercise: no (13-Jul-2009)  Risk Factors: Smoking Status: never (07-13-09) Passive Smoke Exposure: no (13-Jul-2009)  Review of Systems      See HPI General:  Denies fever. ENT:  Complains of decreased hearing, earache, postnasal drainage, sinus pressure, and sore throat; denies difficulty swallowing and ear discharge. Resp:  Complains of cough; denies shortness of breath and wheezing.  Physical Exam  General:  Well-developed,well-nourished,in no acute distress; alert,appropriate and cooperative throughout examination, mildly overweigh, minimally congested. VSS Ears:  Left ear- TM bulding, erythematous, no obvious perforation. Right TM- mod thick fluid behind TM Nose:  No nasal discharge, mild mucosal pallor.   Mouth:  pharyngeal erythema.  no exudates, mild PND. Lungs:  Normal respiratory effort, chest expands symmetrically. no wheezes or crackles.   Heart:  Normal rate and regular rhythm. S1 and S2 normal without gallop, murmur, click, rub or other extra sounds. Skin:  Intact without suspicious lesions or rashes Cervical Nodes:  No lymphadenopathy noted Psych:  Cognition and judgment appear intact. Alert and cooperative with normal attention span and concentration. No apparent delusions, illusions, hallucinations   Impression & Recommendations:  Problem # 1:  OTHER ACUTE SINUSITIS (ICD-461.8) Assessment New with left OM. Will treat with Zpack (PCN allergic). Continue mucinex, ibuprofen as needed pain. The following  medications were removed from the medication list:    Ery-tab 333 Mg Tbec (Erythromycin base) ..... One tab by mouth three times a day His updated medication list for this problem includes:    Flonase 50 Mcg/act Susp (Fluticasone propionate) ..... One inhalation each nostril once daily as needed    Azithromycin 250 Mg Tabs (Azithromycin) .Marland Kitchen... 2 by  mouth today and then 1 daily for 4 days  Problem # 2:  UNSPECIFIED OTITIS MEDIA (ICD-382.9) Assessment: New See above, Zpack.   The following medications were removed from the medication list:    Ery-tab 333 Mg Tbec (Erythromycin base) ..... One tab by mouth three times a day His updated medication list for this problem includes:    Azithromycin 250 Mg Tabs (Azithromycin) .Marland Kitchen... 2 by  mouth today and then 1 daily for 4 days  Complete Medication List: 1)  Allegra 180 Mg Tabs (Fexofenadine hcl) .... One tab by mouth once daily as needed 2)  Flonase 50 Mcg/act Susp (Fluticasone propionate) .... One inhalation each nostril once daily as needed 3)  Metformin Hcl 500 Mg Tabs (Metformin hcl) .... One tab by mouth at nite 4)  Pravachol 20 Mg Tabs (Pravastatin sodium) .... 1/2  tab by mouth at night. 5)  Onetouch Test Strp (Glucose blood) .... Check blood sugar once a day 6)  Onetouch Lancets Misc (Lancets) .... Check blood sugar once a day 7)  Vitamin C 500 Mg Tabs (Ascorbic acid) .... Take one by mouth daily 8)  Azithromycin 250 Mg Tabs (Azithromycin) .... 2 by  mouth today and then 1 daily for 4 days Prescriptions: AZITHROMYCIN 250 MG  TABS (AZITHROMYCIN) 2 by  mouth today and then 1 daily for 4 days  #6 x 0   Entered and Authorized by:   Ruthe Mannan MD   Signed by:   Ruthe Mannan MD on 07/16/2010   Method used:   [facility]        [facility] [address] (retail)       [address]       [place], [place]  [address]       Ph: [phone] or [phone]       Fax: [phone]   RxID:   [id number]    Orders Added: 1)  Est.  Patient Level IV [87564]    Current Allergies (reviewed today): ! PCN

## 2010-09-16 ENCOUNTER — Encounter: Payer: Self-pay | Admitting: Family Medicine

## 2010-09-16 ENCOUNTER — Ambulatory Visit (INDEPENDENT_AMBULATORY_CARE_PROVIDER_SITE_OTHER): Payer: Managed Care, Other (non HMO) | Admitting: Family Medicine

## 2010-09-16 ENCOUNTER — Telehealth: Payer: Self-pay | Admitting: Family Medicine

## 2010-09-16 DIAGNOSIS — J018 Other acute sinusitis: Secondary | ICD-10-CM

## 2010-09-17 ENCOUNTER — Telehealth: Payer: Self-pay | Admitting: Family Medicine

## 2010-09-24 NOTE — Progress Notes (Signed)
Summary: regarding next  cpx   Phone Note Call from Patient Call back at Home Phone [phone]   Caller: Patient Call For: Shaune Leeks MD Summary of Call: Patient was told to call and schedule appt. before further refills. After receiving message patient called [place] back and says that he was told at his last cpx that he wouldn't need to come back in for another 2 years for cpx. I told him that it is typically 1 year. He wanted me to ask you if it should be 1 or 2 years. Please advise.  Initial call taken by: Melody Comas,  September 17, 2010 3:13 PM  Follow-up for Phone Call        I don't know if he misunderstood me or was told 2 years by someone else but my note says to come back "in the Fall." I would like to see him...pls ask him to bring his BS diary. I typically see evenn my most well controlled diabetics at least twice a year. Follow-up by: Shaune Leeks MD,  September 17, 2010 9:29 PM  Additional Follow-up for Phone Call Additional follow up Details #1::        Patient notified, cpx with labs prior scheduled.  Additional Follow-up by: Melody Comas,  September 18, 2010 8:53 AM

## 2010-09-24 NOTE — Progress Notes (Signed)
Summary: Rx Flonase  Phone Note Refill Request Call back at [phone] Message from:  [facility] on September 16, 2010 3:13 PM  Refills Requested: Medication #1:  FLONASE 50 MCG/ACT  SUSP one inhalation each nostril once daily as needed   Last Refilled: 06/15/2010  Method Requested: Electronic Initial call taken by: Sydell Axon LPN,  September 16, 2010 3:14 PM    Prescriptions: Aleda Grana 50 MCG/ACT  SUSP (FLUTICASONE PROPIONATE) one inhalation each nostril once daily as needed  #1 MDI x 0   Entered and Authorized by:   Shaune Leeks MD   Signed by:   Shaune Leeks MD on 09/17/2010   Method used:   [facility] to        [facility] [phone]* (retail)       [address] [place], [place]  [address]       Ph: [phone] or [phone]       Fax: [phone]   RxID:   [id number]  Please have [pt make appt to see me and also schedule a Comp Exam, if he still considers me his doctor. Haven't seen him since 1/11. Shaune Leeks MD  September 17, 2010 10:36 AM   Patient advised via message to call and schedule appt at his earliest convience and also schedule a physical .Consuello Masse CMA

## 2010-09-24 NOTE — Assessment & Plan Note (Signed)
Summary: cough x 2 weeks   Vital Signs:  Patient profile:   40 year old male Height:      67 inches Weight:      221.50 pounds BMI:     34.82 Temp:     97.7 degrees F oral Pulse rate:   75 / minute Pulse rhythm:   regular BP sitting:   110 / 82  (left arm) Cuff size:   large  Vitals Entered By: Linde Gillis CMA Duncan Dull) (September 16, 2010 11:43 AM) CC: cough x 2 weeks   History of Present Illness: 40 yo with 15 days  of worsening URI symptoms. Started with runnny nose, sinus pressure. Now has productive cough- yellowish sputum.  NO wheezing or shortness of breath. Throat is sore and ears are popping. No fevers or chills.  Taking Mucinex and Delsym with mild relief of symptoms.   Current Medications (verified): 1)  Allegra 180 Mg  Tabs (Fexofenadine Hcl) .... One Tab By Mouth Once Daily As Needed 2)  Flonase 50 Mcg/act  Susp (Fluticasone Propionate) .... One Inhalation Each Nostril Once Daily As Needed 3)  Metformin Hcl 500 Mg Tabs (Metformin Hcl) .... One Tab By Mouth At [facility] 4)  Pravachol 20 Mg Tabs (Pravastatin Sodium) .... 1/2  Tab By Mouth At Night. 5)  Onetouch Test  Strp (Glucose Blood) .... Check Blood Sugar Once A Day 6)  Onetouch Lancets  Misc (Lancets) .... Check Blood Sugar Once A Day 7)  Vitamin C 500 Mg Tabs (Ascorbic Acid) .... Take One By Mouth Daily 8)  Azithromycin 250 Mg  Tabs (Azithromycin) .... 2 By  Mouth Today and Then 1 Daily For 4 Days  Allergies: 1)  ! Pcn  Past History:  Past Medical History: Last updated: 08/02/2007 Allergic rhinitis:(11/2000)  Past Surgical History: Last updated: 08/02/2007 BILAT HERNIA REPAIR  19 MOS  ULTRASOUND OF ABDOMEN ; NORMAL:(09/03/2000) L MIDDLE FINGER LACERATION 2004  Family History: Last updated: 07/29/09 Father: DECEASED 62   MI , DM; ELEVATED CHOLESTEROL; HTN (SMOKER) Mother: DECEASED 47  COPD(SMOKER) ; BREAST CANCER MATAST: SISTER 40 Off dairy, s/p gallbladder MOTHER DECEASED MI; + FATHER MI HBP: +  FATHER DM: + FATHER; MGF GOUT/ARTHRITIS: PROSTATE CANCER:  BREAST CANCER: + MOTHER // ? AUNT MYELODYSP; COLON CANCER: DEPRESSION: NEGTIVE ETOH/DRUG ABUSE: NEGATIVE OTHER: NEGATIVE STROKE  Social History: Last updated: 2009-07-29 Marital Status: Married LIVES WITH WIFE Children: Daughter 02/26/2009 Occupation: [employer]  Risk Factors: Alcohol Use: <1 (07-29-2009) Caffeine Use: 2 (07-29-09) Exercise: no (07-29-2009)  Risk Factors: Smoking Status: never (07/29/09) Passive Smoke Exposure: no (07/29/09)  Review of Systems      See HPI General:  Denies fever. ENT:  Complains of nasal congestion, postnasal drainage, sinus pressure, and sore throat. Resp:  Complains of cough and sputum productive; denies shortness of breath and wheezing.  Physical Exam  General:  Well-developed,well-nourished,in no acute distress; alert,appropriate and cooperative throughout examination, mildly overweigh, minimally congested. VSS Nose:  No nasal discharge, mild mucosal pallor.   Mouth:  pharyngeal erythema.  no exudates, mild PND. Lungs:  Normal respiratory effort, chest expands symmetrically.scant exp wheeze, RLL Heart:  Normal rate and regular rhythm. S1 and S2 normal without gallop, murmur, click, rub or other extra sounds. Extremities:  No clubbing, cyanosis, edema, or deformity noted with normal full range of motion of all joints.   Neurologic:  No cranial nerve deficits noted. Station and gait are normal. Plantar reflexes are down-going bilaterally. DTRs are symmetrical throughout. Sensory, motor  and coordinative functions appear intact. Psych:  Cognition and judgment appear intact. Alert and cooperative with normal attention span and concentration. No apparent delusions, illusions, hallucinations   Impression & Recommendations:  Problem # 1:  OTHER ACUTE SINUSITIS (ICD-461.8) Assessment New Given duration and progression of symptoms, will treat with Zpack  (PCN allergy). Supportive care as per pt instructions.  His updated medication list for this problem includes:    Flonase 50 Mcg/act Susp (Fluticasone propionate) ..... One inhalation each nostril once daily as needed    Azithromycin 250 Mg Tabs (Azithromycin) .Marland Kitchen... 2 by  mouth today and then 1 daily for 4 days  Complete Medication List: 1)  Allegra 180 Mg Tabs (Fexofenadine hcl) .... One tab by mouth once daily as needed 2)  Flonase 50 Mcg/act Susp (Fluticasone propionate) .... One inhalation each nostril once daily as needed 3)  Metformin Hcl 500 Mg Tabs (Metformin hcl) .... One tab by mouth at nite 4)  Pravachol 20 Mg Tabs (Pravastatin sodium) .... 1/2  tab by mouth at night. 5)  Onetouch Test Strp (Glucose blood) .... Check blood sugar once a day 6)  Onetouch Lancets Misc (Lancets) .... Check blood sugar once a day 7)  Vitamin C 500 Mg Tabs (Ascorbic acid) .... Take one by mouth daily 8)  Azithromycin 250 Mg Tabs (Azithromycin) .... 2 by  mouth today and then 1 daily for 4 days  Patient Instructions: 1)  Take Zpack as directed.  Drink lots of fluids.  Treat sympotmatically with Mucinex, nasal saline irrigation, and Tylenol/Ibuprofen. Also try claritin D or zyrtec D over the counter- two times a day as needed ( have to sign for them at pharmacy). You can use warm compresses.  Cough suppressant at night. Call if not improving as expected in 5-7 days.  Prescriptions: AZITHROMYCIN 250 MG  TABS (AZITHROMYCIN) 2 by  mouth today and then 1 daily for 4 days  #6 x 0   Entered and Authorized by:   Ruthe Mannan MD   Signed by:   Ruthe Mannan MD on 09/16/2010   Method used:   [facility]        [id number]       [address] [place], [place]  [address]       Ph: [phone] or [phone]       Fax: [phone]   RxID:   [id number]    Orders Added: 1)  Est. Patient Level III [74259]    Current Allergies (reviewed today): ! PCN

## 2010-09-27 ENCOUNTER — Telehealth: Payer: Self-pay | Admitting: *Deleted

## 2010-09-27 MED ORDER — AZITHROMYCIN 250 MG PO TABS: ORAL_TABLET | ORAL | Status: AC

## 2010-09-27 NOTE — Telephone Encounter (Signed)
Patient advised as instructed via telephone.  [facility]

## 2010-09-27 NOTE — Telephone Encounter (Signed)
Typically do not do this without being seen but since we have no availability, ok to send in refill of zpack, use as directed with no refills.   Drink lots of fluids.  Treat sympotmatically with Mucinex, nasal saline irrigation, and Tylenol/Ibuprofen. Also try claritin D or zyrtec D over the counter- two times a day as needed ( have to sign for them at pharmacy). You can use warm compresses.  Cough suppressant at night. Call if not improving as expected in 5-7 days.

## 2010-09-27 NOTE — Telephone Encounter (Signed)
Pt was seen on 3/12 for sinus infection and he says he is not any better.  He was given a z-pack which seemed to help for awhile but now he again has yellow green sinus drainage.  He has runny nose, cough, sore throat.  No fever.  He thinks he needs another round of antibiotics.  [facility].

## 2010-10-21 ENCOUNTER — Other Ambulatory Visit: Payer: Self-pay | Admitting: *Deleted

## 2010-10-21 MED ORDER — METFORMIN HCL 500 MG PO TABS: ORAL_TABLET | ORAL | Status: DC

## 2010-10-21 MED ORDER — FEXOFENADINE HCL 180 MG PO TABS: 180.0000 mg | ORAL_TABLET | Freq: Every day | ORAL | Status: DC

## 2010-11-05 ENCOUNTER — Encounter: Payer: Self-pay | Admitting: Family Medicine

## 2010-11-05 ENCOUNTER — Ambulatory Visit (INDEPENDENT_AMBULATORY_CARE_PROVIDER_SITE_OTHER): Payer: Managed Care, Other (non HMO) | Admitting: Family Medicine

## 2010-11-05 VITALS — BP 120/80 | HR 64 | Temp 97.9°F | Ht 67.0 in | Wt 224.8 lb

## 2010-11-05 DIAGNOSIS — J018 Other acute sinusitis: Secondary | ICD-10-CM

## 2010-11-05 DIAGNOSIS — J069 Acute upper respiratory infection, unspecified: Secondary | ICD-10-CM

## 2010-11-05 LAB — HM DIABETES EYE EXAM

## 2010-11-05 LAB — HM DIABETES FOOT EXAM

## 2010-11-05 MED ORDER — AZITHROMYCIN 250 MG PO TABS: ORAL_TABLET | ORAL | Status: DC

## 2010-11-05 MED ORDER — AZITHROMYCIN 250 MG PO TABS: ORAL_TABLET | ORAL | Status: AC

## 2010-11-05 NOTE — Progress Notes (Signed)
40  yo with 1 week of worsening URI symptoms. Started with runnny nose, sinus pressure. Now has productive cough- yellowish sputum.  NO wheezing or shortness of breath. Throat is sore and ears are popping.  Had fever this morning, Tmax 101.4. Took Tylenol 3 hours ago, currently afebrile.  Daughter being treated for PNA.  Pt is allergic to PCN.  Taking Mucinex and Delsym with mild relief of symptoms.  The PMH, PSH, Social History, Family History, Medications, and allergies have been reviewed in [facility], and have been updated if relevant.  Review of Systems       See HPI General:  Denies fever. ENT:  Complains of nasal congestion, postnasal drainage, sinus pressure, and sore throat. Resp:  Complains of cough and sputum productive; denies shortness of breath and wheezing.  Physical Exam BP 120/80  Pulse 64  Temp(Src) 97.9 F (36.6 C) (Oral)  Ht 5\' 7"  (1.702 m)  Wt 224 lb 12.8 oz (101.969 kg)  BMI 35.21 kg/m2  General:  Well-developed,well-nourished,in no acute distress; alert,appropriate and cooperative throughout examination, mildly overweigh, minimally congested. VSS Nose:  No nasal discharge, mild mucosal pallor.   Mouth:  pharyngeal erythema.  no exudates, mild PND. Lungs:  Normal respiratory effort, chest expands symmetrically.scant exp wheeze, RLL Heart:  Normal rate and regular rhythm. S1 and S2 normal without gallop, murmur, click, rub or other extra sounds. Extremities:  No clubbing, cyanosis, edema, or deformity noted with normal full range of motion of all joints.   Neurologic:  No cranial nerve deficits noted. Station and gait are normal. Plantar reflexes are down-going bilaterally. DTRs are symmetrical throughout. Sensory, motor and coordinative functions appear intact. Psych:  Cognition and judgment appear intact. Alert and cooperative with normal attention span and concentration. No apparent delusions, illusions, hallucinations

## 2010-11-05 NOTE — Assessment & Plan Note (Signed)
Given duration and progression of symptoms with recent sick contact in his daughter, will treat for bacterial process with Zpack (PCN allergic). Continue supportive care with Delsym and Mucinex.

## 2010-11-05 NOTE — Progress Notes (Signed)
Addended by: Ruthe Mannan on: 11/05/2010 12:19 PM   Modules accepted: Orders

## 2010-11-08 ENCOUNTER — Ambulatory Visit (INDEPENDENT_AMBULATORY_CARE_PROVIDER_SITE_OTHER): Payer: Managed Care, Other (non HMO) | Admitting: Family Medicine

## 2010-11-08 ENCOUNTER — Encounter: Payer: Self-pay | Admitting: Family Medicine

## 2010-11-08 VITALS — BP 122/80 | HR 80 | Temp 98.5°F | Wt 224.0 lb

## 2010-11-08 DIAGNOSIS — H6690 Otitis media, unspecified, unspecified ear: Secondary | ICD-10-CM | POA: Insufficient documentation

## 2010-11-08 MED ORDER — HYDROCODONE-ACETAMINOPHEN 5-500 MG PO TABS: 1.0000 | ORAL_TABLET | Freq: Three times a day (TID) | ORAL | Status: DC | PRN

## 2010-11-08 NOTE — Progress Notes (Signed)
L ear pain.  Was in earlier in the week with Dr. Dayton Martes.  Pain worse this AM.  Daughter was sick and the patient then got URI sx.  Prev with post nasal gtt and cough.  Still on zithromax.  No FCNAV now.  Last fever Monday.  Sugar controlled per patient.   Meds, vitals, and allergies reviewed.   ROS: See HPI.  Otherwise, noncontributory.  GEN: nad, alert and oriented HEENT: mucous membranes moist, R tm w/o erythema, L TM bulging and red but canal/pinna wnl, nasal exam w/minimal erythema, clear discharge noted,  OP with cobblestoning NECK: supple w/o LA CV: rrr.   PULM: ctab, no inc wob EXT: no edema SKIN: no acute rash

## 2010-11-08 NOTE — Patient Instructions (Signed)
Finish the antibiotics, take ibuprofen 200mg  tabs (3 tab 3 times a day) with food, and use the vicodin as needed.  Let us know if the pain isn't improving or if you continue to have trouble hearing.  Take care.

## 2010-11-08 NOTE — Assessment & Plan Note (Signed)
Already on zmax.  i would finish this and use ibuprofen with GI caution in meantime.  If pain not improved, can take vicodin with sedation caution.

## 2010-11-15 ENCOUNTER — Encounter: Payer: Self-pay | Admitting: Family Medicine

## 2010-11-15 ENCOUNTER — Ambulatory Visit (INDEPENDENT_AMBULATORY_CARE_PROVIDER_SITE_OTHER): Payer: Managed Care, Other (non HMO) | Admitting: Family Medicine

## 2010-11-15 VITALS — BP 102/72 | HR 84 | Temp 98.0°F | Wt 226.0 lb

## 2010-11-15 DIAGNOSIS — H6690 Otitis media, unspecified, unspecified ear: Secondary | ICD-10-CM

## 2010-11-15 NOTE — Patient Instructions (Signed)
Keep using the nasal spray and the allergy medicine.  If this isn't getting better in about 1 week, then call me so we can set up an ENT appointment.  Take care.  Call back sooner if needed.

## 2010-11-15 NOTE — Progress Notes (Signed)
Prev note reviewed.  Pain is better in L ear but hearing is still affected.  He took the zmax and used vicodin prn for pain with relief.  "still with clogged feeling."  Still with yellow and green nasal discharge.  Cough is improved.  No fevers.  "I thought my left ear may pop a few times, but it didn't."   Meds, vitals, and allergies reviewed.   ROS: See HPI.  Otherwise, noncontributory.  nad ncat R tm wnl L tm with small amount of erythema but much improved. Pinna wnl x2 Nasal exam with mild erythema Op wnl Neck supple Weber localized to L ear, Air>bone x2.

## 2010-11-15 NOTE — Assessment & Plan Note (Addendum)
Improved with less erythema on TM.  Less pain, no fever, nontoxic.  Anatomy d/w pt.  I would allow this more time, continue current meds and will refer to ENT if sx don't totally resolve.  He agrees.

## 2010-12-16 ENCOUNTER — Other Ambulatory Visit: Payer: Self-pay | Admitting: Family Medicine

## 2010-12-16 NOTE — Telephone Encounter (Signed)
Received electronic refill request from pharmacy. Is it okay to refill this? Please verify dispense number and refills.

## 2011-01-13 ENCOUNTER — Other Ambulatory Visit: Payer: Self-pay | Admitting: Family Medicine

## 2011-01-27 ENCOUNTER — Other Ambulatory Visit: Payer: Managed Care, Other (non HMO)

## 2011-01-30 ENCOUNTER — Encounter: Payer: Managed Care, Other (non HMO) | Admitting: Family Medicine

## 2011-02-13 ENCOUNTER — Ambulatory Visit (INDEPENDENT_AMBULATORY_CARE_PROVIDER_SITE_OTHER): Payer: Managed Care, Other (non HMO) | Admitting: Family Medicine

## 2011-02-13 ENCOUNTER — Encounter: Payer: Self-pay | Admitting: Family Medicine

## 2011-02-13 ENCOUNTER — Other Ambulatory Visit: Payer: Self-pay | Admitting: Family Medicine

## 2011-02-13 DIAGNOSIS — R0789 Other chest pain: Secondary | ICD-10-CM | POA: Insufficient documentation

## 2011-02-13 DIAGNOSIS — R071 Chest pain on breathing: Secondary | ICD-10-CM

## 2011-02-13 DIAGNOSIS — E119 Type 2 diabetes mellitus without complications: Secondary | ICD-10-CM

## 2011-02-13 DIAGNOSIS — E781 Pure hyperglyceridemia: Secondary | ICD-10-CM

## 2011-02-13 NOTE — Assessment & Plan Note (Signed)
He brought his glucose numbers since Jan in to be evaled. Nos during day generally good (70s-100s), bedtime nos slightly higher at 100s- 120s, (occas 200) and morning nos generally highest at 110s- 130s, prob avg of 120s. Discussed holding Metformin at night until seen in two weeks.

## 2011-02-13 NOTE — Progress Notes (Signed)
  Subjective:    Patient ID: Bryan Doyle, male    DOB: 22-Sep-1970, 40 y.o.   MRN: [id number]  HPI Pt here as acute appointment for chest soreness. He has had this for two weeks and is moving at times. It is today on the upper left just lateral to the sternum , last week on the right just inner and upper to the nipple. It is sore not pain. The only lifting he does is her daughter, two years old now and possibly 25 pounds. He does lift her with outstretched arms at times. He has had no trauma, bruising or erythema. He has noticed no swelling or warmth in any area of the chest. He has not had fatigue, swelling of the hands or feet and no PAIN with exertion. BMs have been nml. Appetite is nml. No heartburn of swallowing problems. Urination is ok, no flank pain.    Review of SystemsNoncontributory except as above.       Objective:   Physical Exam  Constitutional: He appears well-developed and well-nourished. No distress.  HENT:  Head: Normocephalic and atraumatic.  Right Ear: External ear normal.  Left Ear: External ear normal.  Nose: Nose normal.  Mouth/Throat: Oropharynx is clear and moist.  Eyes: Conjunctivae and EOM are normal. Pupils are equal, round, and reactive to light. Right eye exhibits no discharge. Left eye exhibits no discharge.  Neck: Normal range of motion. Neck supple.  Cardiovascular: Normal rate and regular rhythm.   Pulmonary/Chest: Effort normal and breath sounds normal. He has no wheezes. He has no rales. He exhibits no tenderness.       A/P and Lat chestwall pressure is assymptomatic.  Lymphadenopathy:    He has no cervical adenopathy.  Skin: He is not diaphoretic.          Assessment & Plan:

## 2011-02-13 NOTE — Assessment & Plan Note (Signed)
History and  Exam consistent with muscle strain of the chest wall. Discussed and reassured. Will reeval when seen for PE in two weeks.

## 2011-02-17 ENCOUNTER — Telehealth: Payer: Self-pay | Admitting: *Deleted

## 2011-02-17 NOTE — Telephone Encounter (Signed)
His appt is on Wed. We will be able to do w/o a prob. Will be able to be read on that Fri. Ask him to remind me when here.

## 2011-02-17 NOTE — Telephone Encounter (Signed)
Left message on voice mail  to call back

## 2011-02-17 NOTE — Telephone Encounter (Signed)
Patient notified as instructed by telephone. 

## 2011-02-17 NOTE — Telephone Encounter (Signed)
Patient is coming in for cpx on 8-22 and would like to get a tb skin test done while he is here. He said that his motherinlaw tested positive for tb and he would like to be tested.

## 2011-02-20 ENCOUNTER — Other Ambulatory Visit (INDEPENDENT_AMBULATORY_CARE_PROVIDER_SITE_OTHER): Payer: Managed Care, Other (non HMO) | Admitting: Family Medicine

## 2011-02-20 DIAGNOSIS — E119 Type 2 diabetes mellitus without complications: Secondary | ICD-10-CM

## 2011-02-20 DIAGNOSIS — E781 Pure hyperglyceridemia: Secondary | ICD-10-CM

## 2011-02-20 DIAGNOSIS — R7402 Elevation of levels of lactic acid dehydrogenase (LDH): Secondary | ICD-10-CM

## 2011-02-20 LAB — RENAL FUNCTION PANEL
CO2: 26 mEq/L (ref 19–32)
Calcium: 8.7 mg/dL (ref 8.4–10.5)
GFR: 89.23 mL/min (ref >60.00)
Glucose, Bld: 121 mg/dL — ABNORMAL HIGH (ref 70–99)
Potassium: 3.9 mEq/L (ref 3.5–5.1)
Sodium: 137 mEq/L (ref 135–145)

## 2011-02-20 LAB — LIPID PANEL
Cholesterol: 110 mg/dL (ref 0–200)
VLDL: 35.2 mg/dL (ref 0.0–40.0)

## 2011-02-20 LAB — HEPATIC FUNCTION PANEL
ALT: 36 U/L (ref 0–53)
AST: 26 U/L (ref 0–37)
Bilirubin, Direct: 0 mg/dL (ref 0.0–0.3)
Total Bilirubin: 0.6 mg/dL (ref 0.3–1.2)

## 2011-02-20 LAB — TSH: TSH: 1.32 u[IU]/mL (ref 0.35–5.50)

## 2011-02-26 ENCOUNTER — Ambulatory Visit (INDEPENDENT_AMBULATORY_CARE_PROVIDER_SITE_OTHER): Payer: Managed Care, Other (non HMO) | Admitting: Family Medicine

## 2011-02-26 ENCOUNTER — Other Ambulatory Visit: Payer: Self-pay | Admitting: Family Medicine

## 2011-02-26 ENCOUNTER — Encounter: Payer: Self-pay | Admitting: Family Medicine

## 2011-02-26 DIAGNOSIS — E119 Type 2 diabetes mellitus without complications: Secondary | ICD-10-CM

## 2011-02-26 DIAGNOSIS — E781 Pure hyperglyceridemia: Secondary | ICD-10-CM

## 2011-02-26 DIAGNOSIS — R071 Chest pain on breathing: Secondary | ICD-10-CM

## 2011-02-26 DIAGNOSIS — J309 Allergic rhinitis, unspecified: Secondary | ICD-10-CM

## 2011-02-26 DIAGNOSIS — R0789 Other chest pain: Secondary | ICD-10-CM

## 2011-02-26 MED ORDER — NYSTATIN 100000 UNIT/GM EX CREA: TOPICAL_CREAM | Freq: Two times a day (BID) | CUTANEOUS | Status: DC

## 2011-02-26 NOTE — Assessment & Plan Note (Signed)
Seems well controlled. Cont curr Allegra.

## 2011-02-26 NOTE — Assessment & Plan Note (Signed)
No improvement from stopping Metformin at night on the morning elevations.  Back on evening 500mg  Metformin and add same at [facility]. Recheck. Lab Results  Component Value Date   HGBA1C 6.5 02/20/2011

## 2011-02-26 NOTE — Patient Instructions (Signed)
RTC 3 mos for recheck of DM.

## 2011-02-26 NOTE — Assessment & Plan Note (Signed)
Resolved

## 2011-02-26 NOTE — Progress Notes (Signed)
  Subjective:    Patient ID: Bryan Doyle, male    DOB: 06/23/1971, 40 y.o.   MRN: [id number]  HPI Pt here for Comp Exam. He was seen recently for chest wall pain and told to use conservative approach. It has since resolved. He also had sugars higher in AM and was told to hold his evening Metformin. This made no demonstrable difference.  His midday's are good, his evening can be up and morning slightly elevated. Will add Metformin at [facility] and recheck. He also thought he may need a PPD but his mother-in-law's (the initial concern) was negative.    Review of Systems  Constitutional: Negative for fever, chills, diaphoresis, appetite change, fatigue and unexpected weight change.  HENT: Negative for hearing loss, ear pain, tinnitus and ear discharge.   Eyes: Negative for pain, discharge and visual disturbance.  Respiratory: Negative for cough, shortness of breath and wheezing.   Cardiovascular: Negative for chest pain and palpitations.       No SOB w/ exertion  Gastrointestinal: Negative for nausea, vomiting, abdominal pain, diarrhea, constipation and blood in stool.       No heartburn or swallowing problems.  Genitourinary: Negative for dysuria, frequency and difficulty urinating.       Mild, occas  nocturia  Musculoskeletal: Negative for myalgias, back pain and arthralgias.  Skin: Positive for rash (in the inguinal area.).       No itching or dryness.  Neurological: Negative for tremors and numbness.       No tingling or balance problems.  Hematological: Negative for adenopathy. Does not bruise/bleed easily.  Psychiatric/Behavioral: Negative for dysphoric mood and agitation.       Objective:   Physical Exam  Constitutional: He is oriented to person, place, and time. He appears well-developed and well-nourished. No distress.  HENT:  Head: Normocephalic and atraumatic.  Right Ear: External ear normal.  Left Ear: External ear normal.  Nose: Nose normal.  Mouth/Throat: Oropharynx  is clear and moist.  Eyes: Conjunctivae and EOM are normal. Pupils are equal, round, and reactive to light. Right eye exhibits no discharge. Left eye exhibits no discharge. No scleral icterus.  Neck: Normal range of motion. Neck supple. No thyromegaly present.  Cardiovascular: Normal rate, regular rhythm, normal heart sounds and intact distal pulses.   No murmur heard. Pulmonary/Chest: Effort normal and breath sounds normal. No respiratory distress. He has no wheezes.  Abdominal: Soft. Bowel sounds are normal. He exhibits no distension and no mass. There is no tenderness. There is no rebound and no guarding.  Genitourinary: Penis normal.       No hernias felt.  Musculoskeletal: Normal range of motion. He exhibits no edema.  Lymphadenopathy:    He has no cervical adenopathy.  Neurological: He is alert and oriented to person, place, and time. Coordination normal.  Skin: Skin is warm and dry. No rash noted. He is not diaphoretic.  Psychiatric: He has a normal mood and affect. His behavior is normal. Judgment and thought content normal.          Assessment & Plan:  HMPE

## 2011-02-26 NOTE — Assessment & Plan Note (Addendum)
Ver normal on this set of labs. Cont Pravastatin.

## 2011-02-26 NOTE — Assessment & Plan Note (Addendum)
HDL slightly low, Trig slightly high but new finding, LDL great. Try to diet and exercise as discussed. Cont Pravachol. Lab Results  Component Value Date   CHOL 110 02/20/2011   CHOL 98 10/04/2009   CHOL 145 06/26/2009   Lab Results  Component Value Date   HDL 26.80* 02/20/2011   HDL 25.20* 10/04/2009   HDL 25.30* 06/26/2009   Lab Results  Component Value Date   LDLCALC 48 02/20/2011   LDLCALC 47 10/04/2009   LDLCALC 91 06/26/2009   Lab Results  Component Value Date   TRIG 176.0* 02/20/2011   TRIG 130.0 10/04/2009   TRIG 145.0 06/26/2009   Lab Results  Component Value Date   CHOLHDL 4 02/20/2011   CHOLHDL 4 10/04/2009   CHOLHDL 6 06/26/2009   No results found for this basename: LDLDIRECT

## 2011-03-17 ENCOUNTER — Other Ambulatory Visit: Payer: Self-pay | Admitting: *Deleted

## 2011-03-17 MED ORDER — METFORMIN HCL 500 MG PO TABS: 500.0000 mg | ORAL_TABLET | Freq: Two times a day (BID) | ORAL | Status: DC

## 2011-05-05 ENCOUNTER — Encounter: Payer: Self-pay | Admitting: Family Medicine

## 2011-05-05 ENCOUNTER — Ambulatory Visit (INDEPENDENT_AMBULATORY_CARE_PROVIDER_SITE_OTHER): Payer: Managed Care, Other (non HMO) | Admitting: Family Medicine

## 2011-05-05 VITALS — BP 98/68 | HR 80 | Temp 98.4°F | Wt 222.0 lb

## 2011-05-05 DIAGNOSIS — L0390 Cellulitis, unspecified: Secondary | ICD-10-CM | POA: Insufficient documentation

## 2011-05-05 DIAGNOSIS — L0291 Cutaneous abscess, unspecified: Secondary | ICD-10-CM

## 2011-05-05 MED ORDER — SULFAMETHOXAZOLE-TRIMETHOPRIM 800-160 MG PO TABS: 2.0000 | ORAL_TABLET | Freq: Two times a day (BID) | ORAL | Status: AC

## 2011-05-05 NOTE — Patient Instructions (Signed)
Call me with an update in 2 days, sooner if you have concerns.  Start the antibiotics today.  Take care.

## 2011-05-05 NOTE — Progress Notes (Signed)
Rash.  In groin.  Itching and tender.  Started Saturday.  No FCNAV.  BP is similar to prev.  No orthostasis.    nad ncat mmm abd with normal inspection except for 8x5cm blanching red patch w/o fluctuant mass on suprapubic area.  Is warm and mildly ttp.  Groin wnl o/w.

## 2011-05-06 NOTE — Assessment & Plan Note (Addendum)
Nontoxic, okay for outpatient f/u.  Border marked and pt to notify clinic if progressive.  Start septra and notify clinic if not resolved.  He agrees.  No need/indication for I&D.

## 2011-05-07 ENCOUNTER — Encounter: Payer: Self-pay | Admitting: Family Medicine

## 2011-05-07 ENCOUNTER — Ambulatory Visit (INDEPENDENT_AMBULATORY_CARE_PROVIDER_SITE_OTHER): Payer: Managed Care, Other (non HMO) | Admitting: Family Medicine

## 2011-05-07 VITALS — BP 100/60 | HR 76 | Temp 97.8°F | Wt 219.0 lb

## 2011-05-07 DIAGNOSIS — L03319 Cellulitis of trunk, unspecified: Secondary | ICD-10-CM

## 2011-05-07 DIAGNOSIS — L0390 Cellulitis, unspecified: Secondary | ICD-10-CM

## 2011-05-07 NOTE — Progress Notes (Signed)
  Subjective:    Patient ID: Bryan Doyle, male    DOB: Aug 03, 1970, 40 y.o.   MRN: [id number]  HPI Pt here for recheck after being seen by Dr Para March 2 days ago for cellulitis of the suprapubic area and beibng started on Bactrim DS 2 bid. HE is diabetic.  He has been having discomfort and called last night for pain relief. Was given Tramadol but had diarrhea overnight and prefers not to take this. He has seen no change in his sugar control. He is unaware of bug bite or trauma to skin. His wife had similar episode on her buttocks two weeks ago treated by Dr Dayton Martes with Bactrim and heat.    Review of SystemsNoncontributory except as above.       Objective:   Physical ExamArea looks unchanged from Dr Lianne Bushy note, certainly no worse but organized centrally with firm area approx 3cm high by 5cm wide located centrally in the affected area. No spontaneous discharge.        Assessment & Plan:

## 2011-05-07 NOTE — Assessment & Plan Note (Addendum)
Appears stable to improved. Cont Bactrim Use heat. Take Tyl. RTC 7 days.

## 2011-05-07 NOTE — Patient Instructions (Signed)
RTC 7 Nov for recheck. Use heat on the area. May take Tyl 500mg , 2 three times a day.

## 2011-05-08 ENCOUNTER — Telehealth: Payer: Self-pay | Admitting: Family Medicine

## 2011-05-08 NOTE — Telephone Encounter (Signed)
Pt seen the next day and looked stable. Told to continue present trmt.

## 2011-05-08 NOTE — Telephone Encounter (Signed)
Call A Nurse report 10/31: Pt calling about having pain from Cellulitis on above Groin above Penis, onset 05-05-11. Redness and warm to touch have not worsen seen MD evaluated, only pain has worsen. Pt was DX by Dr Para March on 05-05-11, ABX started. Pt took Acetaminophen x1 extra strength, no improvement. Afebrile. Consulted w/ Dr Alwyn Ren, verbal order Tramadol 50mg  q6 hrs PRN #20, [facility] [place], [phone]. Advised Pt to f/u w/ Dr Para March if pain does not improve. Pt verbalized understanding.

## 2011-05-14 ENCOUNTER — Telehealth: Payer: Self-pay | Admitting: *Deleted

## 2011-05-14 ENCOUNTER — Ambulatory Visit (INDEPENDENT_AMBULATORY_CARE_PROVIDER_SITE_OTHER): Payer: Managed Care, Other (non HMO) | Admitting: Family Medicine

## 2011-05-14 ENCOUNTER — Encounter: Payer: Self-pay | Admitting: Family Medicine

## 2011-05-14 DIAGNOSIS — Z888 Allergy status to other drugs, medicaments and biological substances status: Secondary | ICD-10-CM

## 2011-05-14 DIAGNOSIS — IMO0002 Reserved for concepts with insufficient information to code with codable children: Secondary | ICD-10-CM | POA: Insufficient documentation

## 2011-05-14 DIAGNOSIS — L0390 Cellulitis, unspecified: Secondary | ICD-10-CM

## 2011-05-14 DIAGNOSIS — L0291 Cutaneous abscess, unspecified: Secondary | ICD-10-CM

## 2011-05-14 MED ORDER — MUPIROCIN CALCIUM 2 % EX CREA: TOPICAL_CREAM | Freq: Three times a day (TID) | CUTANEOUS | Status: AC

## 2011-05-14 NOTE — Telephone Encounter (Signed)
Note continued from below.  Wife states pt's temp is now 102.6.

## 2011-05-14 NOTE — Progress Notes (Signed)
  Subjective:    Patient ID: Bryan Doyle, male    DOB: 25-Mar-1971, 40 y.o.   MRN: [id number]  HPI Pt here with wife for recheck of cellulitis in suprapubic area. Heat has really helped a lot with eventual spontaneous eruption and drainage. The firmness has almost totally resolved but it still continues to drain. He woke up this AM with erythematous maculopapular rash over the trunk and coalescently on the face and neck. He also had a fever to 102 when he awakened. He took Tyl and felt better with fever defervescing to 100. He last took Septra this AM when he got up. Is on double dose therapy due to suspected MRSA infection above. He otherwise feels well with no other overt complaints.    Review of Systems  Constitutional: Negative for fever, chills, diaphoresis, activity change, appetite change and fatigue.  HENT: Negative for hearing loss, ear pain, congestion, sore throat, rhinorrhea, neck pain, neck stiffness, postnasal drip, sinus pressure, tinnitus and ear discharge.   Eyes: Negative for pain, discharge and visual disturbance.  Respiratory: Negative for cough, shortness of breath and wheezing.   Cardiovascular: Negative for chest pain and palpitations.       No SOB w/ exertion  Gastrointestinal:       No heartburn or swallowing problems.  Genitourinary: Negative for dysuria, hematuria and difficulty urinating.       No nocturia  Skin: Rash: see HPI.       No itching or dryness.  Neurological:       No tingling or balance problems.  All other systems reviewed and are negative.       Objective:   Physical Exam        Assessment & Plan:

## 2011-05-14 NOTE — Telephone Encounter (Signed)
Pt's wife called.  Pt was seen earlier today.  Pt has temp of 101 with chills and shaking.  She is asking if pt needs to go to ER, because of the shaking.  Advised her that he has the chills and shaking because of the fever and that he should take tylenol or motrin to bring it down.  Also advised to increase fluids.  Please advise.

## 2011-05-14 NOTE — Telephone Encounter (Signed)
FYI Pt has appt today at 12 for cellulitis f/u. He called stating that he woke up with a rash from head to toe and a fever 102.2 and congestion. He is on septra for cellulitis and has not had any problems from med so far. I advised keep appt and that he could take tyl for rever until seen.

## 2011-05-14 NOTE — Patient Instructions (Addendum)
Stop Septra DS. Apply Bactroban twice a day. Take Zyrtec 10mg  daily until rash resolved. Drink lots of fluids. Call tomorrow with response.

## 2011-05-14 NOTE — Telephone Encounter (Signed)
Discussed with spouse. He is on no other form of tylenol or acetaminophen. Have him take Tyl 500mg  2 tabs every 6 hrs for the next 48 hrs and then as needed.  Call me tomorrow with report. His fever was 103, his rash has not seemed to have extended, or worsened.

## 2011-05-14 NOTE — Assessment & Plan Note (Signed)
Presumed reaction to Septra. Stop medication. Flush with fluids. Cont Tyl regularly for today. Zyrtec now. Call tomm with report.

## 2011-05-14 NOTE — Assessment & Plan Note (Signed)
Significantly improved. No more Septra but continue heat applications.  Will add Bactroban as topical application bid.

## 2011-05-15 NOTE — Telephone Encounter (Signed)
Spoke with pt. Rash less intense but now on legs al;so. Feels ok excwept no appetite and rash very itchy. Told to take Zyrtec as before, can add Benadryl if doesn't mind sedation and take Pepcid bid. Cont tyl. Will call this PM.

## 2011-05-15 NOTE — Telephone Encounter (Signed)
Spoke with pt directly and sounds stable, rash not more extended from this AM, fever had been down until an hour ago, appetite still off, having some diarrhea, peeing ok, slept poorly last night. Will call over the next few days.

## 2011-05-15 NOTE — Telephone Encounter (Signed)
Pt's wife called with update.  His temp was below 100 most of today but has spiked back up to 101.4.  Pt continues to rest, still has the rash.

## 2011-05-20 NOTE — Telephone Encounter (Signed)
Spoke with pt daily over the weekend. He continues to improve. Think the problem is improving and was allergic reaction to the Septra.

## 2011-06-05 ENCOUNTER — Encounter: Payer: Self-pay | Admitting: Family Medicine

## 2011-06-05 ENCOUNTER — Ambulatory Visit (INDEPENDENT_AMBULATORY_CARE_PROVIDER_SITE_OTHER): Payer: Managed Care, Other (non HMO) | Admitting: Family Medicine

## 2011-06-05 VITALS — BP 106/68 | HR 80 | Temp 98.0°F | Wt 217.2 lb

## 2011-06-05 DIAGNOSIS — L0291 Cutaneous abscess, unspecified: Secondary | ICD-10-CM

## 2011-06-05 DIAGNOSIS — E119 Type 2 diabetes mellitus without complications: Secondary | ICD-10-CM

## 2011-06-05 DIAGNOSIS — L0390 Cellulitis, unspecified: Secondary | ICD-10-CM

## 2011-06-05 DIAGNOSIS — J069 Acute upper respiratory infection, unspecified: Secondary | ICD-10-CM | POA: Insufficient documentation

## 2011-06-05 MED ORDER — FEXOFENADINE HCL 180 MG PO TABS: 180.0000 mg | ORAL_TABLET | Freq: Every day | ORAL | Status: DC

## 2011-06-05 NOTE — Patient Instructions (Signed)
RTC 3/13 to estab with Dr Para March and recheck DM, A1C prior.

## 2011-06-05 NOTE — Assessment & Plan Note (Signed)
Nos generally the highest in the AM in the 120 range. O/W other nos 80-90s.  Feels well. Looks to be good control. Try small snack at bedtime. Recheck in Mar w/ Dr Para March.

## 2011-06-05 NOTE — Assessment & Plan Note (Signed)
Doing well, resolving. Cont Guaif.

## 2011-06-05 NOTE — Assessment & Plan Note (Signed)
Sclerotic nidus of residual body reaction to infection. Discussed.

## 2011-06-05 NOTE — Progress Notes (Signed)
  Subjective:    Patient ID: Bryan Doyle, male    DOB: 02/06/1971, 40 y.o.   MRN: [id number]  HPI Pt here for three month followup of DM. We increased his Metformin to bid, noon and PM. In the interim he has had a skin infection of the suprapubic area and was put on Septra at MRSA doses and had rteaction to that. Did well from the Ab, heat and Bactroban with continued resolution. He now has a sclerotic residual reaction from the infection.    Review of SystemsStill has mild URI sxs.     Objective:   Physical Exam  Constitutional: He appears well-developed and well-nourished. No distress.  HENT:  Head: Normocephalic and atraumatic.  Right Ear: External ear normal.  Left Ear: External ear normal.  Nose: Nose normal.  Mouth/Throat: Oropharynx is clear and moist.       Mild sluggishness of TMs L>R.  Eyes: Conjunctivae and EOM are normal. Pupils are equal, round, and reactive to light. Right eye exhibits no discharge. Left eye exhibits no discharge.  Neck: Normal range of motion. Neck supple.  Cardiovascular: Normal rate and regular rhythm.   Pulmonary/Chest: Effort normal and breath sounds normal. He has no wheezes.  Lymphadenopathy:    He has no cervical adenopathy.  Skin: He is not diaphoretic.          Assessment & Plan:

## 2011-07-08 HISTORY — PX: VASECTOMY: SHX75

## 2011-07-10 ENCOUNTER — Encounter: Payer: Self-pay | Admitting: Family Medicine

## 2011-07-10 ENCOUNTER — Ambulatory Visit (INDEPENDENT_AMBULATORY_CARE_PROVIDER_SITE_OTHER): Payer: Managed Care, Other (non HMO) | Admitting: Family Medicine

## 2011-07-10 DIAGNOSIS — J069 Acute upper respiratory infection, unspecified: Secondary | ICD-10-CM

## 2011-07-10 DIAGNOSIS — M109 Gout, unspecified: Secondary | ICD-10-CM

## 2011-07-10 DIAGNOSIS — Z8739 Personal history of other diseases of the musculoskeletal system and connective tissue: Secondary | ICD-10-CM | POA: Insufficient documentation

## 2011-07-10 MED ORDER — BENZONATATE 200 MG PO CAPS: 200.0000 mg | ORAL_CAPSULE | Freq: Three times a day (TID) | ORAL | Status: AC | PRN

## 2011-07-10 MED ORDER — AZITHROMYCIN 250 MG PO TABS: ORAL_TABLET | ORAL | Status: AC

## 2011-07-10 NOTE — Patient Instructions (Addendum)
If you have more flares of gout, let me know and see if you can identify any food triggers.   Take the tessalon for cough and start the antibiotics in a few days if not better.  Drink plenty of fluids and try to get some rest.  I'll see you in March, sooner if needed.

## 2011-07-10 NOTE — Progress Notes (Signed)
Cough and rhinorrhea for 10 days.  Sleep is disrupted.  No sputum.  No fevers.  No ear pain, but his ear's are popping.  No facial pain, but nose is sore from blowing nose.    07/01/11.  Foot pain.  No trauma.  Got worse over the next few days.  Went to UC.  Was told it was gout, but was told his uric acid level was normal. This is the first known episode. Pain was at first MTP on L foot.  Pain is much improved.  He took colchicine, had diarrhea after 6th dose.    Meds, vitals, and allergies reviewed.   ROS: See HPI.  Otherwise, noncontributory.  GEN: nad, alert and oriented HEENT: mucous membranes moist, tm w/o erythema, nasal exam w/o erythema but stuffy, clear discharge noted,  OP with cobblestoning,sinuses not ttp NECK: supple w/o LA CV: rrr.   PULM: ctab, no inc wob EXT: no edema SKIN: no acute rash L foot: Minimally puffy L 1st MTP, no erythema

## 2011-07-11 ENCOUNTER — Encounter: Payer: Self-pay | Admitting: Family Medicine

## 2011-07-11 NOTE — Assessment & Plan Note (Signed)
Nontoxic, I'd give this a few days to resolve and then if still persistent would start abx. F/u prn. Supportive measures o/w.  He agrees.

## 2011-07-11 NOTE — Assessment & Plan Note (Signed)
Presumed, first flare.  Path/phys d/w pt.  Food list given, f/u prn.

## 2011-07-30 ENCOUNTER — Telehealth: Payer: Self-pay | Admitting: *Deleted

## 2011-07-30 NOTE — Telephone Encounter (Signed)
Left message on voice mail  to call back

## 2011-07-30 NOTE — Telephone Encounter (Signed)
Left message advising patient as instructed on personal voicemail at work.

## 2011-07-30 NOTE — Telephone Encounter (Signed)
There is a potential for cross reaction (PCN/keflex).  I would see if Dr. Achilles Dunk can use any other abx.  Pt is allergic to sulfa.  Doxycycline may be useful in this case, but I'll defer to Dr. Achilles Dunk.  Thanks.

## 2011-07-30 NOTE — Telephone Encounter (Signed)
Patient saw Dr. Achilles Dunk (urologist) and plans on having a vasectomy. Patient states that Dr. Achilles Dunk wants to put him on Keflex to prevent an infection from the surgery. Patient states that he was allergic to Penicillin as a child and is concerned about taking Keflex. Patient states that he just wants your opinion. Please advise.

## 2011-08-03 ENCOUNTER — Encounter: Payer: Self-pay | Admitting: Family Medicine

## 2011-08-03 DIAGNOSIS — Z3009 Encounter for other general counseling and advice on contraception: Secondary | ICD-10-CM | POA: Insufficient documentation

## 2011-09-10 ENCOUNTER — Other Ambulatory Visit (INDEPENDENT_AMBULATORY_CARE_PROVIDER_SITE_OTHER): Payer: Managed Care, Other (non HMO)

## 2011-09-10 DIAGNOSIS — E119 Type 2 diabetes mellitus without complications: Secondary | ICD-10-CM

## 2011-09-10 LAB — HEMOGLOBIN A1C: Hgb A1c MFr Bld: 6.4 % (ref 4.6–6.5)

## 2011-09-16 ENCOUNTER — Ambulatory Visit: Payer: Managed Care, Other (non HMO) | Admitting: Family Medicine

## 2011-09-22 ENCOUNTER — Telehealth: Payer: Self-pay | Admitting: *Deleted

## 2011-09-22 NOTE — Telephone Encounter (Signed)
Patient was advised of his labs results from 09/10/2011 by Aram Beecham.

## 2011-09-23 ENCOUNTER — Ambulatory Visit (INDEPENDENT_AMBULATORY_CARE_PROVIDER_SITE_OTHER): Payer: Managed Care, Other (non HMO) | Admitting: Family Medicine

## 2011-09-23 ENCOUNTER — Encounter: Payer: Self-pay | Admitting: Family Medicine

## 2011-09-23 VITALS — BP 118/78 | HR 68 | Temp 98.4°F | Wt 222.0 lb

## 2011-09-23 DIAGNOSIS — E119 Type 2 diabetes mellitus without complications: Secondary | ICD-10-CM

## 2011-09-23 NOTE — Patient Instructions (Signed)
Keep working your weight and recheck labs before a physical this fall.  Take care.  Glad to see you.

## 2011-09-23 NOTE — Progress Notes (Signed)
Diabetes:  Using medications without difficulties: yes Hypoglycemic episodes: no Hyperglycemic episodes:no Feet problems: no Blood Sugars averaging: ~ 105-130.  A1c 6.4 We talked about exercise and diet.  He'll work on those.   Meds, vitals, and allergies reviewed.   ROS: See HPI.  Otherwise negative.    GEN: nad, alert and oriented HEENT: mucous membranes moist NECK: supple w/o LA CV: rrr. PULM: ctab, no inc wob ABD: soft, +bs EXT: no edema SKIN: no acute rash  Diabetic foot exam: Normal inspection No skin breakdown No calluses  Normal DP pulses Normal sensation to light touch and monofilament Nails normal

## 2011-09-23 NOTE — Assessment & Plan Note (Signed)
Continue meds, work on diet and weight, recheck labs in fall 2013 before a CPE.

## 2011-09-24 ENCOUNTER — Encounter: Payer: Self-pay | Admitting: *Deleted

## 2011-09-30 ENCOUNTER — Other Ambulatory Visit: Payer: Self-pay | Admitting: Family Medicine

## 2011-09-30 ENCOUNTER — Encounter: Payer: Self-pay | Admitting: Family Medicine

## 2011-10-23 ENCOUNTER — Ambulatory Visit (INDEPENDENT_AMBULATORY_CARE_PROVIDER_SITE_OTHER): Payer: Managed Care, Other (non HMO) | Admitting: Family Medicine

## 2011-10-23 ENCOUNTER — Encounter: Payer: Self-pay | Admitting: Family Medicine

## 2011-10-23 VITALS — BP 112/80 | HR 63 | Temp 98.7°F | Wt 229.0 lb

## 2011-10-23 DIAGNOSIS — J069 Acute upper respiratory infection, unspecified: Secondary | ICD-10-CM

## 2011-10-23 NOTE — Patient Instructions (Signed)
I would take some pseudophed before the plane trip and use flonase (2 sprays per nostril per day).  Gently try to 'pop' your ears a few times a day.

## 2011-10-23 NOTE — Progress Notes (Signed)
Recent cold sx.  L ear pain stated about 1 week ago. L ear feel clogged.  No FCNAVD.  Some rhinorrhea, R ear feels okay, if felt slightly clogged prev.  No ST.  He has to fly on Sunday, going to [place].    Meds, vitals, and allergies reviewed.   ROS: See HPI.  Otherwise, noncontributory.  nad ncat B SOM, but with TM movement on valsalva.  No TM erythema.  Nasal exam mildly stuffy.  Op with mild cobblestoning.  Sinuses not ttp x4  Neck supple rrr ctab

## 2011-10-24 NOTE — Assessment & Plan Note (Signed)
With [facility] and ETD.  Would use pseudophed before the plane trip, gently pressurize the ETD and continue flonase.  F/u prn.

## 2012-01-30 ENCOUNTER — Telehealth: Payer: Self-pay | Admitting: Family Medicine

## 2012-01-30 NOTE — Telephone Encounter (Signed)
Agreed -

## 2012-01-30 NOTE — Telephone Encounter (Signed)
Caller: Jihad/Patient; PCP: Crawford Givens Clelia Croft); CB#: [phone]; ; ; Call regarding Foot Pain;   Patient states he has hx of Gout. Patient states he is vacationing. States he developed pain un right great toe, onset 01/29/12. States toe is red and swollen. Denies sx of infection, no red streaks. Afebrile. Patient states pain is "exactly like" when he had gout in the past. States toe appears "bruised". States purple/blue in color. Denies injury. Denies numbness or tingling.  States toe is warm to touch. Patient states pain and swelling noted "in the knuckle" of the great toe. Patient has hx of Diabetes. Triage per Foot Non-Injury and Diabetes: Foot Problems Protocol. Care advice given per guidelines. Patient advised to be evaluated in local ED/UC (patient is out of town) due to joint swelling causing difficulty in movement. Patient verbalizes understanding and agreeable.

## 2012-02-09 ENCOUNTER — Other Ambulatory Visit: Payer: Self-pay | Admitting: Family Medicine

## 2012-03-05 ENCOUNTER — Other Ambulatory Visit: Payer: Self-pay | Admitting: Family Medicine

## 2012-03-15 ENCOUNTER — Other Ambulatory Visit (INDEPENDENT_AMBULATORY_CARE_PROVIDER_SITE_OTHER): Payer: Managed Care, Other (non HMO)

## 2012-03-15 DIAGNOSIS — E119 Type 2 diabetes mellitus without complications: Secondary | ICD-10-CM

## 2012-03-15 LAB — COMPREHENSIVE METABOLIC PANEL
ALT: 50 U/L (ref 0–53)
AST: 35 U/L (ref 0–37)
Calcium: 8.8 mg/dL (ref 8.4–10.5)
Chloride: 106 mEq/L (ref 96–112)
Creatinine, Ser: 1.1 mg/dL (ref 0.4–1.5)
Potassium: 3.9 mEq/L (ref 3.5–5.1)
Sodium: 139 mEq/L (ref 135–145)
Total Protein: 7 g/dL (ref 6.0–8.3)

## 2012-03-15 LAB — LIPID PANEL
HDL: 30.6 mg/dL — ABNORMAL LOW (ref >39.00)
Total CHOL/HDL Ratio: 4

## 2012-03-15 LAB — HEMOGLOBIN A1C: Hgb A1c MFr Bld: 6.6 % — ABNORMAL HIGH (ref 4.6–6.5)

## 2012-03-18 ENCOUNTER — Ambulatory Visit (INDEPENDENT_AMBULATORY_CARE_PROVIDER_SITE_OTHER): Payer: Managed Care, Other (non HMO) | Admitting: Family Medicine

## 2012-03-18 ENCOUNTER — Encounter: Payer: Self-pay | Admitting: Family Medicine

## 2012-03-18 VITALS — BP 104/74 | HR 74 | Temp 98.2°F | Wt 227.0 lb

## 2012-03-18 DIAGNOSIS — E781 Pure hyperglyceridemia: Secondary | ICD-10-CM

## 2012-03-18 DIAGNOSIS — M722 Plantar fascial fibromatosis: Secondary | ICD-10-CM

## 2012-03-18 DIAGNOSIS — Z0000 Encounter for general adult medical examination without abnormal findings: Secondary | ICD-10-CM | POA: Insufficient documentation

## 2012-03-18 DIAGNOSIS — E119 Type 2 diabetes mellitus without complications: Secondary | ICD-10-CM

## 2012-03-18 MED ORDER — FEXOFENADINE HCL 180 MG PO TABS: 180.0000 mg | ORAL_TABLET | Freq: Every day | ORAL | Status: DC

## 2012-03-18 MED ORDER — FLUTICASONE PROPIONATE 50 MCG/ACT NA SUSP: 2.0000 | Freq: Every day | NASAL | Status: DC

## 2012-03-18 MED ORDER — PRAVASTATIN SODIUM 10 MG PO TABS: 10.0000 mg | ORAL_TABLET | Freq: Every day | ORAL | Status: DC

## 2012-03-18 MED ORDER — METFORMIN HCL 500 MG PO TABS: 500.0000 mg | ORAL_TABLET | Freq: Two times a day (BID) | ORAL | Status: DC

## 2012-03-18 NOTE — Progress Notes (Signed)
CPE- See plan.  Routine anticipatory guidance given to patient.  See health maintenance. Tetanus 2010 Flu shot encouraged, usually done at work Colon and prostate cancer screening not indicated now Advance directive d/w pt.  He has a living will. Wife is designated if he is incapacitated.   Elevated Cholesterol: Using medications without problems: yes Muscle aches: no Diet compliance:yes Exercise: "not enough"  He's had some L plantar fascia pain, with first steps in AM.   Diabetes:  Using medications without difficulties:yes Hypoglycemic episodes: very rare Hyperglycemic episodes:no Feet problems:no Blood Sugars averaging: ~90-130 D/w pt about diet, weight, exercise.   Gout.  Had a flare this summer.  Was on vacation.  Had some shellfish just before it happened.  1-2 flares in last 12 months.  Isn't on prev treatment. Treated with colchicine and indomethacin prev.   PMH and SH reviewed  Meds, vitals, and allergies reviewed.   ROS: See HPI.  Otherwise negative.    GEN: nad, alert and oriented HEENT: mucous membranes moist NECK: supple w/o LA CV: rrr. PULM: ctab, no inc wob ABD: soft, +bs EXT: no edema SKIN: no acute rash  Diabetic foot exam: Normal inspection No skin breakdown No calluses  Normal DP pulses Normal sensation to light touch and monofilament Nails normal

## 2012-03-18 NOTE — Assessment & Plan Note (Signed)
Routine anticipatory guidance given to patient.  See health maintenance. Tetanus 2010 Flu shot encouraged, usually done at work Colon and prostate cancer screening not indicated now Advance directive d/w pt.  He has a living will. Wife is designated if he is incapacitated.

## 2012-03-18 NOTE — Patient Instructions (Addendum)
Recheck labs in 6 months before a visit.  Stretch your foot as we discussed (before you get out of bed) and keep working on your diet.  Take care.   I would get a flu shot each fall.

## 2012-03-21 DIAGNOSIS — M722 Plantar fascial fibromatosis: Secondary | ICD-10-CM | POA: Insufficient documentation

## 2012-03-21 NOTE — Assessment & Plan Note (Signed)
Controlled, continue current meds.  Labs d/w pt.  D/w pt about diet/exercise/weight.  

## 2012-03-21 NOTE — Assessment & Plan Note (Signed)
He'll work on diet, continue prn meds.

## 2012-03-21 NOTE — Assessment & Plan Note (Signed)
D/w pt about anatomy, stretching.  He can f/u prn. He agrees.

## 2012-03-21 NOTE — Assessment & Plan Note (Signed)
Controlled, continue current meds.  Labs d/w pt.  D/w pt about diet/exercise/weight.

## 2012-05-06 ENCOUNTER — Telehealth: Payer: Self-pay

## 2012-05-06 NOTE — Telephone Encounter (Signed)
Pt will have [employer] home delivery fax a PA request to Dr Para March.

## 2012-05-06 NOTE — Telephone Encounter (Signed)
Pt left v/m [employer] needs pre cert on fexofenadine. Left v/m for pt to call back;needs PA request from pharmacy to have needed info for PA.

## 2012-05-14 NOTE — Telephone Encounter (Signed)
Pt called back with [employer] # for me to call; Monia Pouch has already faxed PA. I called spoke with Ladona Ridgel who transferred me to [phone] and spoke with Nadine Counts; Bod said pts benefits exclude OTC meds except Xyzal,Claritin and Claritin D which will still require a PA but for meds such as Fexofenadine,Alavert and Zyrtec would not get approved even with PA.Notified pt and he will ck with his benefit coordinator and call our office back.

## 2012-05-19 NOTE — Telephone Encounter (Signed)
Pt left v/m returning call;call pt [phone].

## 2012-05-19 NOTE — Telephone Encounter (Signed)
Patient notified as instructed by telephone. 

## 2012-05-19 NOTE — Telephone Encounter (Signed)
Left message on voice mail  to call back

## 2012-05-19 NOTE — Telephone Encounter (Signed)
In that event, OTC generic claritin would likely be cheapest alternative for patient.

## 2012-05-19 NOTE — Telephone Encounter (Signed)
Prior auth denied for Fexofenadine. Letter on Dr Lianne Bushy desk for signature and scanning.See note 05/14/12.

## 2012-07-23 ENCOUNTER — Encounter: Payer: Self-pay | Admitting: Family Medicine

## 2012-07-23 ENCOUNTER — Ambulatory Visit (INDEPENDENT_AMBULATORY_CARE_PROVIDER_SITE_OTHER): Payer: Managed Care, Other (non HMO) | Admitting: Family Medicine

## 2012-07-23 VITALS — BP 110/70 | HR 86 | Temp 98.8°F | Ht 67.0 in | Wt 229.2 lb

## 2012-07-23 DIAGNOSIS — J3489 Other specified disorders of nose and nasal sinuses: Secondary | ICD-10-CM

## 2012-07-23 MED ORDER — DOXYCYCLINE HYCLATE 100 MG PO TABS: 100.0000 mg | ORAL_TABLET | Freq: Two times a day (BID) | ORAL | Status: DC

## 2012-07-23 NOTE — Patient Instructions (Addendum)
Hold the doxycycline for now.  Start if the sinus pressure is worse.  Use nasal saline in the meantime.  Take care.

## 2012-07-23 NOTE — Progress Notes (Signed)
duration of symptoms: "I think I've had a cold since 06/22/12".  Worse in the last week.   rhinorrhea:yes Congestion: yes, sinus pressure ear pain: no pain but some pressure sore throat: only in AM Cough: yes, dry myalgias:no other concerns: he's been resting and trying to treat this supportively.  Headaches noted, some relief temporarily with hot compresses.   Some vomiting.   No fever- t max is only 99.1.   Had a flu shot.   Using flonase in the meantime.   No sick contacts.    ROS: See HPI.  Otherwise negative.    Meds, vitals, and allergies reviewed.   GEN: nad, alert and oriented HEENT: mucous membranes moist, TM w/o erythema, nasal epithelium injected, OP with cobblestoning, sinuses not ttp x4 NECK: supple w/o LA CV: rrr. PULM: ctab, no inc wob ABD: soft, +bs EXT: no edema

## 2012-07-25 DIAGNOSIS — J3489 Other specified disorders of nose and nasal sinuses: Secondary | ICD-10-CM | POA: Insufficient documentation

## 2012-07-25 NOTE — Assessment & Plan Note (Signed)
Not ttp on the sinuses today. Nontoxic, this may be resolved.  Would hold doxy for now and start if sx continue, worsen. He agrees.  Supportive tx o/w.  F/u prn.  He agrees.

## 2012-09-27 ENCOUNTER — Other Ambulatory Visit (INDEPENDENT_AMBULATORY_CARE_PROVIDER_SITE_OTHER): Payer: Managed Care, Other (non HMO)

## 2012-09-27 DIAGNOSIS — E119 Type 2 diabetes mellitus without complications: Secondary | ICD-10-CM

## 2012-09-30 ENCOUNTER — Encounter: Payer: Self-pay | Admitting: Family Medicine

## 2012-09-30 ENCOUNTER — Ambulatory Visit (INDEPENDENT_AMBULATORY_CARE_PROVIDER_SITE_OTHER): Payer: Managed Care, Other (non HMO) | Admitting: Family Medicine

## 2012-09-30 VITALS — BP 102/72 | HR 65 | Temp 98.1°F | Wt 222.2 lb

## 2012-09-30 DIAGNOSIS — E119 Type 2 diabetes mellitus without complications: Secondary | ICD-10-CM

## 2012-09-30 NOTE — Progress Notes (Signed)
Diabetes:  Using medications without difficulties: yes Hypoglycemic episodes:no Hyperglycemic episodes:no Feet problems:no Blood Sugars averaging: usually ~90-130 in AMs eye exam within last year: done in 1/14.  Normal exam per patient.  a1c up to 6.8.  D/w pt.  He isn't skipping meals but is eating less.  Exercise- "none".  d/w pt.  Thinking about joining a tennis club.    PMH and SH reviewed  Meds, vitals, and allergies reviewed.   ROS: See HPI.  Otherwise negative.    GEN: nad, alert and oriented HEENT: mucous membranes moist NECK: supple w/o LA CV: rrr. PULM: ctab, no inc wob ABD: soft, +bs EXT: no edema SKIN: no acute rash  Diabetic foot exam: Normal inspection No skin breakdown No calluses  Normal DP pulses Normal sensation to light touch and monofilament Nails normal

## 2012-09-30 NOTE — Patient Instructions (Addendum)
Recheck labs in 6 months before a physical.  Try to work on getting more exercise.   Keep working on [employer].  Take care.  Glad to see you.

## 2012-09-30 NOTE — Assessment & Plan Note (Signed)
A1c trending up.  He'll work on getting more exercise- tennis.  Using portion control for diet.  D/w pt about packing lunch and meal planning.  Recheck in 6 months.  He agrees.

## 2012-11-15 ENCOUNTER — Other Ambulatory Visit: Payer: Self-pay

## 2012-11-15 MED ORDER — GLUCOSE BLOOD VI STRP: ORAL_STRIP | Status: DC

## 2012-11-15 MED ORDER — ONETOUCH ULTRASOFT LANCETS MISC: Status: DC

## 2012-11-15 NOTE — Telephone Encounter (Signed)
Printed.  Thanks.  

## 2012-11-15 NOTE — Telephone Encounter (Signed)
Left detailed message on voicemail.  

## 2012-11-15 NOTE — Telephone Encounter (Signed)
Pt request written rx for One Touch Ultra test strips and lancets. Call pt when ready for pick up.

## 2013-01-13 ENCOUNTER — Other Ambulatory Visit: Payer: Self-pay

## 2013-03-10 ENCOUNTER — Other Ambulatory Visit: Payer: Self-pay | Admitting: Family Medicine

## 2013-03-10 DIAGNOSIS — E119 Type 2 diabetes mellitus without complications: Secondary | ICD-10-CM

## 2013-03-14 ENCOUNTER — Other Ambulatory Visit (INDEPENDENT_AMBULATORY_CARE_PROVIDER_SITE_OTHER): Payer: Managed Care, Other (non HMO)

## 2013-03-14 DIAGNOSIS — E119 Type 2 diabetes mellitus without complications: Secondary | ICD-10-CM

## 2013-03-14 LAB — COMPREHENSIVE METABOLIC PANEL
AST: 32 U/L (ref 0–37)
Albumin: 3.9 g/dL (ref 3.5–5.2)
Alkaline Phosphatase: 86 U/L (ref 39–117)
BUN: 12 mg/dL (ref 6–23)
Glucose, Bld: 127 mg/dL — ABNORMAL HIGH (ref 70–99)
Potassium: 3.9 mEq/L (ref 3.5–5.1)
Sodium: 139 mEq/L (ref 135–145)
Total Bilirubin: 0.8 mg/dL (ref 0.3–1.2)
Total Protein: 6.7 g/dL (ref 6.0–8.3)

## 2013-03-14 LAB — MICROALBUMIN / CREATININE URINE RATIO
Creatinine,U: 340.9 mg/dL
Microalb Creat Ratio: 0.8 mg/g (ref 0.0–30.0)

## 2013-03-14 LAB — LIPID PANEL
Cholesterol: 114 mg/dL (ref 0–200)
LDL Cholesterol: 64 mg/dL (ref 0–99)
Total CHOL/HDL Ratio: 4
Triglycerides: 120 mg/dL (ref 0.0–149.0)
VLDL: 24 mg/dL (ref 0.0–40.0)

## 2013-03-21 ENCOUNTER — Ambulatory Visit (INDEPENDENT_AMBULATORY_CARE_PROVIDER_SITE_OTHER): Payer: Managed Care, Other (non HMO) | Admitting: Family Medicine

## 2013-03-21 ENCOUNTER — Encounter: Payer: Self-pay | Admitting: Family Medicine

## 2013-03-21 VITALS — BP 112/74 | HR 67 | Temp 98.3°F | Ht 68.0 in | Wt 215.8 lb

## 2013-03-21 DIAGNOSIS — Z0000 Encounter for general adult medical examination without abnormal findings: Secondary | ICD-10-CM

## 2013-03-21 DIAGNOSIS — E781 Pure hyperglyceridemia: Secondary | ICD-10-CM

## 2013-03-21 DIAGNOSIS — Z23 Encounter for immunization: Secondary | ICD-10-CM

## 2013-03-21 DIAGNOSIS — E119 Type 2 diabetes mellitus without complications: Secondary | ICD-10-CM

## 2013-03-21 MED ORDER — LISINOPRIL 2.5 MG PO TABS: 2.5000 mg | ORAL_TABLET | Freq: Every day | ORAL | Status: DC

## 2013-03-21 MED ORDER — FLUTICASONE PROPIONATE 50 MCG/ACT NA SUSP: 2.0000 | Freq: Every day | NASAL | Status: DC

## 2013-03-21 MED ORDER — PRAVASTATIN SODIUM 10 MG PO TABS: 10.0000 mg | ORAL_TABLET | Freq: Every day | ORAL | Status: DC

## 2013-03-21 MED ORDER — METFORMIN HCL 500 MG PO TABS: 500.0000 mg | ORAL_TABLET | Freq: Two times a day (BID) | ORAL | Status: DC

## 2013-03-21 NOTE — Progress Notes (Signed)
CPE- See plan.  Routine anticipatory guidance given to patient.  See health maintenance. PSA and colon cancer screening not due. Discussed.  Tetanus 2010 Flu shot done at work.   PNA shot due 2014 Living will prev done. Wife designated if incapacitated.  Diet and exercise d/w pt.  Not exercising as much as he had hoped.  He has been swimming some. Doing "okay" with diet.  He's usually in the ~210 lbs.    Diabetes:  Using medications without difficulties:yes Hypoglycemic episodes:no Hyperglycemic episodes:no Feet problems:no Blood Sugars averaging: 105-115 eye exam within last year:yes  Elevated Cholesterol: Using medications without problems: yes Muscle aches: no Diet compliance:see above Exercise: see above   PMH and SH reviewed.   Vital signs, Meds and allergies reviewed.  ROS: See HPI.  Otherwise nontributory.   GEN: nad, alert and oriented HEENT: mucous membranes moist NECK: supple w/o LA CV: rrr PULM: ctab, no inc wob ABD: soft, +bs EXT: no edema SKIN: no acute rash  Diabetic foot exam: Normal inspection No skin breakdown No calluses  Normal DP pulses Normal sensation to light tough and monofilament Nails normal

## 2013-03-21 NOTE — Patient Instructions (Addendum)
Start the lisinopril.  If you have any lightheadedness, then let me know and stop the medicine.  Keep work on diet and exercise.  I would get a flu shot each fall.    Recheck A1c in about 6 months before a visit.  Take care. Glad to see you.

## 2013-03-22 NOTE — Assessment & Plan Note (Signed)
Continue current med and work on weight.  Labs discussed.

## 2013-03-22 NOTE — Assessment & Plan Note (Signed)
Routine anticipatory guidance given to patient.  See health maintenance. PSA and colon cancer screening not due. Discussed.  Tetanus 2010 Flu shot done at work.   PNA shot due 2014 Living will prev done. Wife designated if incapacitated.  Diet and exercise d/w pt.  Not exercising as much as he had hoped.  He has been swimming some. Doing "okay" with diet.  He's usually in the ~210 lbs.

## 2013-03-22 NOTE — Assessment & Plan Note (Signed)
Controlled, with MALB add on low dose of ACE and continue to work on diet and weight.  He agrees. Labs dw pt.

## 2013-04-14 ENCOUNTER — Telehealth: Payer: Self-pay

## 2013-04-14 NOTE — Telephone Encounter (Signed)
Pt head is congested, has h/a; no S/T, no earache, no facial soreness,no fever and no dizziness Loratidine OTC is not helping pt request what to do? [facility] [address]Please advise. Pt request cb.

## 2013-04-14 NOTE — Telephone Encounter (Signed)
If he has ragweed or mold senstivity-this might still be allergic, not infectious He can try ADDING cetirizine 10mg  daily or fexofenadine 180mg  daily to the loratadine (which often needs a higher dose---2 a day) If his symptoms persist, or get worse, he will need an appt

## 2013-04-14 NOTE — Telephone Encounter (Signed)
Left detailed message on voicemail.  

## 2013-04-15 ENCOUNTER — Ambulatory Visit: Payer: Managed Care, Other (non HMO) | Admitting: Family Medicine

## 2013-04-15 ENCOUNTER — Ambulatory Visit (INDEPENDENT_AMBULATORY_CARE_PROVIDER_SITE_OTHER): Payer: Managed Care, Other (non HMO) | Admitting: Family Medicine

## 2013-04-15 ENCOUNTER — Encounter: Payer: Self-pay | Admitting: Family Medicine

## 2013-04-15 VITALS — BP 122/78 | HR 65 | Temp 98.6°F | Ht 68.0 in | Wt 213.5 lb

## 2013-04-15 DIAGNOSIS — J019 Acute sinusitis, unspecified: Secondary | ICD-10-CM | POA: Insufficient documentation

## 2013-04-15 MED ORDER — AZITHROMYCIN 250 MG PO TABS
ORAL_TABLET | ORAL | Status: DC
Start: 2013-04-15 — End: 2013-07-26

## 2013-04-15 NOTE — Patient Instructions (Signed)
Drink fluids  use warm compresses on face  Nasal saline Take zpak as directed If worse or not improved in a week let me know

## 2013-04-15 NOTE — Progress Notes (Signed)
Subjective:    Patient ID: Bryan Doyle, male    DOB: Oct 21, 1970, 42 y.o.   MRN: [id number]  HPI Here for sinus/ facial pain on L - prev him from sleeping  Started on wed  Has been fighting allergies  Daughter has a cold No fever Congested - cannot get much out  No drip or st or cough   No meds for this   Has flonase occ saline or netti pot  Patient Active Problem List   Diagnosis Date Noted  . Plantar fasciitis 03/21/2012  . Routine general medical examination at a health care facility 03/18/2012  . Gout 07/10/2011  . Allergy or intolerance to drug 05/14/2011  . CLOSED FRACTURE OF NAVICULAR BONE OF FOOT 06/20/2009  . ANKLE PAIN, LEFT 05/28/2009  . DIABETES MELLITUS 06/26/2008  . HYPERTRIGLYCERIDEMIA 08/02/2007  . ALLERGIC RHINITIS 08/02/2007  . NONSPEC ELEVATION OF LEVELS OF TRANSAMINASE/LDH 08/02/2007   Past Medical History  Diagnosis Date  . Allergy   . Diabetes mellitus   . Gout    Past Surgical History  Procedure Laterality Date  . Hernia repair      bilateral   . Vasectomy  2013    Dr. Achilles Dunk   History  Substance Use Topics  . Smoking status: Never Smoker   . Smokeless tobacco: Never Used  . Alcohol Use: 0.0 oz/week    0.5  drink(s) per week     Comment: rarely   Family History  Problem Relation Age of Onset  . COPD Mother   . Cancer Mother     breast  . Diabetes Father   . Hyperlipidemia Father   . Hypertension Father   . Cancer Paternal Aunt   . Diabetes Maternal Grandfather   . Prostate cancer Maternal Grandfather   . Colon cancer Neg Hx    Allergies  Allergen Reactions  . Penicillins     REACTION: u/k- was told in infancy to avoid  . Sulfa Antibiotics Rash    Severe rash and fever  . Zyrtec [Cetirizine] Nausea And Vomiting   Current Outpatient Prescriptions on File Prior to Visit  Medication Sig Dispense Refill  . Ascorbic Acid (VITAMIN C) 500 MG tablet Take 500 mg by mouth daily.        . fluticasone (FLONASE) 50 MCG/ACT nasal  spray Place 2 sprays into the nose daily.  48 g  3  . glucose blood (ONE TOUCH ULTRA TEST) test strip Test blood sugar once daily and as directed. Dx 250.00  100 each  3  . Lancets (ONETOUCH ULTRASOFT) lancets CHECK BLOOD SUGAR ONCE A DAY. Dx 250.00  100 each  3  . lisinopril (ZESTRIL) 2.5 MG tablet Take 1 tablet (2.5 mg total) by mouth daily.  90 tablet  3  . Loratadine 10 MG CAPS Take 1 capsule by mouth daily.      . metFORMIN (GLUCOPHAGE) 500 MG tablet Take 1 tablet (500 mg total) by mouth 2 (two) times daily with a meal.  180 tablet  3  . pravastatin (PRAVACHOL) 10 MG tablet Take 1 tablet (10 mg total) by mouth daily.  90 tablet  3   No current facility-administered medications on file prior to visit.      Review of Systems Review of Systems  Constitutional: Negative for fever, appetite change, fatigue and unexpected weight change.  ENT pos for congestion and facial pain  Eyes: Negative for pain and visual disturbance.  Respiratory: Negative for cough and shortness of breath.  neg  for wheeze  Cardiovascular: Negative for cp or palpitations    Gastrointestinal: Negative for nausea, diarrhea and constipation. (was nauseated last night) Genitourinary: Negative for urgency and frequency.  Skin: Negative for pallor or rash   Neurological: Negative for weakness, light-headedness, numbness and pos for headache today.  Hematological: Negative for adenopathy. Does not bruise/bleed easily.  Psychiatric/Behavioral: Negative for dysphoric mood. The patient is not nervous/anxious.         Objective:   Physical Exam  Constitutional: He appears well-developed and well-nourished. No distress.  obese and well appearing   HENT:  Head: Normocephalic and atraumatic.  Right Ear: External ear normal.  Left Ear: External ear normal.  Mouth/Throat: Oropharynx is clear and moist. No oropharyngeal exudate.  Nares are injected and congested  Clear rhinorrhea Tender /mild over L ethmoid area No  temporal tenderness Some clear post nasal drip  Eyes: Conjunctivae and EOM are normal. Pupils are equal, round, and reactive to light. Right eye exhibits no discharge. Left eye exhibits no discharge.  Neck: Normal range of motion. Neck supple.  Cardiovascular: Normal rate and regular rhythm.   Pulmonary/Chest: Effort normal and breath sounds normal. No respiratory distress. He has no wheezes. He has no rales.  Lymphadenopathy:    He has no cervical adenopathy.  Neurological: He is alert. He has normal reflexes. No cranial nerve deficit. He exhibits normal muscle tone. Coordination normal.  Skin: Skin is warm and dry. No rash noted.  Psychiatric: He has a normal mood and affect.          Assessment & Plan:

## 2013-04-17 NOTE — Assessment & Plan Note (Signed)
Cover with zpak  Fluids/ steam/ nasal saline  Disc symptomatic care - see instructions on AVS  Update if not starting to improve in a week or if worsening

## 2013-05-10 ENCOUNTER — Encounter: Payer: Self-pay | Admitting: Family Medicine

## 2013-07-26 ENCOUNTER — Encounter: Payer: Self-pay | Admitting: Family Medicine

## 2013-07-26 ENCOUNTER — Ambulatory Visit (INDEPENDENT_AMBULATORY_CARE_PROVIDER_SITE_OTHER): Payer: Managed Care, Other (non HMO) | Admitting: Family Medicine

## 2013-07-26 VITALS — BP 118/84 | HR 62 | Temp 98.2°F | Ht 68.0 in | Wt 210.8 lb

## 2013-07-26 DIAGNOSIS — H6690 Otitis media, unspecified, unspecified ear: Secondary | ICD-10-CM

## 2013-07-26 MED ORDER — AZITHROMYCIN 250 MG PO TABS: ORAL_TABLET | ORAL | Status: DC

## 2013-07-26 NOTE — Progress Notes (Signed)
Pre-visit discussion using our clinic review tool. No additional management support is needed unless otherwise documented below in the visit note.  

## 2013-07-26 NOTE — Progress Notes (Signed)
Subjective:    Patient ID: Bryan Doyle, male    DOB: February 15, 1971, 43 y.o.   MRN: [id number]  HPI Here with ear pain   Started yesterday and came on strong - escalated quickly  L ear  He is prone to an occasional ear infection He has had a cold for week or so   Took some tylenol last night  No fever   Takes flonase   Patient Active Problem List   Diagnosis Date Noted  . Acute sinusitis 04/15/2013  . Plantar fasciitis 03/21/2012  . Routine general medical examination at a health care facility 03/18/2012  . Gout 07/10/2011  . Allergy or intolerance to drug 05/14/2011  . CLOSED FRACTURE OF NAVICULAR BONE OF FOOT 06/20/2009  . ANKLE PAIN, LEFT 05/28/2009  . DIABETES MELLITUS 06/26/2008  . HYPERTRIGLYCERIDEMIA 08/02/2007  . ALLERGIC RHINITIS 08/02/2007  . [place] ELEVATION OF LEVELS OF TRANSAMINASE/LDH 08/02/2007   Past Medical History  Diagnosis Date  . Allergy   . Diabetes mellitus   . Gout    Past Surgical History  Procedure Laterality Date  . Hernia repair      bilateral   . Vasectomy  2013    Dr. Jacqlyn Larsen   History  Substance Use Topics  . Smoking status: Never Smoker   . Smokeless tobacco: Never Used  . Alcohol Use: 0.0 oz/week    0.5  drink(s) per week     Comment: rarely   Family History  Problem Relation Age of Onset  . COPD Mother   . Cancer Mother     breast  . Diabetes Father   . Hyperlipidemia Father   . Hypertension Father   . Cancer Paternal Aunt   . Diabetes Maternal Grandfather   . Prostate cancer Maternal Grandfather   . Colon cancer Neg Hx    Allergies  Allergen Reactions  . Penicillins     REACTION: u/k- was told in infancy to avoid  . Sulfa Antibiotics Rash    Severe rash and fever  . Zyrtec [Cetirizine] Nausea And Vomiting   Current Outpatient Prescriptions on File Prior to Visit  Medication Sig Dispense Refill  . Ascorbic Acid (VITAMIN C) 500 MG tablet Take 500 mg by mouth daily.        . fluticasone (FLONASE) 50 MCG/ACT  nasal spray Place 2 sprays into the nose daily.  48 g  3  . glucose blood (ONE TOUCH ULTRA TEST) test strip Test blood sugar once daily and as directed. Dx 250.00  100 each  3  . Lancets (ONETOUCH ULTRASOFT) lancets CHECK BLOOD SUGAR ONCE A DAY. Dx 250.00  100 each  3  . lisinopril (ZESTRIL) 2.5 MG tablet Take 1 tablet (2.5 mg total) by mouth daily.  90 tablet  3  . Loratadine 10 MG CAPS Take 1 capsule by mouth daily.      . metFORMIN (GLUCOPHAGE) 500 MG tablet Take 1 tablet (500 mg total) by mouth 2 (two) times daily with a meal.  180 tablet  3  . pravastatin (PRAVACHOL) 10 MG tablet Take 1 tablet (10 mg total) by mouth daily.  90 tablet  3   No current facility-administered medications on file prior to visit.    Review of Systems Review of Systems  Constitutional: Negative for fever, appetite change, and unexpected weight chang ENt pos for cong/ rhinorrhea and ear pain w/o drainage.  Eyes: Negative for pain and visual disturbance.  Respiratory: Negative for wheeze  and shortness of breath.  Cardiovascular: Negative for cp or palpitations    Gastrointestinal: Negative for nausea, diarrhea and constipation.  Genitourinary: Negative for urgency and frequency.  Skin: Negative for pallor or rash   Neurological: Negative for weakness, light-headedness, numbness and headaches.  Hematological: Negative for adenopathy. Does not bruise/bleed easily.  Psychiatric/Behavioral: Negative for dysphoric mood. The patient is not nervous/anxious.         Objective:   Physical Exam  Constitutional: He appears well-developed and well-nourished. No distress.  HENT:  Head: Normocephalic and atraumatic.  Right Ear: External ear normal.  Mouth/Throat: Oropharynx is clear and moist. No oropharyngeal exudate.  R TM is dull with small eff L TM - erythematous with effusion and bulging (no rupture) No sinus tenderness Nares are injected and congested   Clear rhinorrhea   Eyes: Conjunctivae and EOM are  normal. Pupils are equal, round, and reactive to light. Right eye exhibits no discharge. Left eye exhibits no discharge.  Neck: Normal range of motion. Neck supple.  Cardiovascular: Normal rate and regular rhythm.   Pulmonary/Chest: Effort normal and breath sounds normal.  Lymphadenopathy:    He has no cervical adenopathy.  Neurological: He is alert.  Skin: Skin is warm and dry. No rash noted. No erythema.  Psychiatric: He has a normal mood and affect.          Assessment & Plan:

## 2013-07-26 NOTE — Assessment & Plan Note (Signed)
With eff  Cover with zpak (is pcn and sulfa all) Inc flonase to max dose  Sudafed prn as needed  Ibuprofen prn pain Warm compresses Disc symptomatic care - see instructions on AVS  Update if not starting to improve in a week or if worsening

## 2013-07-26 NOTE — Patient Instructions (Signed)
You have a L sided ear infection  Ibuprofen otc may help Increase flonase to 2 sprays in each nostril once daily  Take the zithromax as directed  Update if not starting to improve in a week or if worsening

## 2013-09-10 ENCOUNTER — Other Ambulatory Visit: Payer: Self-pay | Admitting: Family Medicine

## 2013-09-10 DIAGNOSIS — E119 Type 2 diabetes mellitus without complications: Secondary | ICD-10-CM

## 2013-09-12 ENCOUNTER — Other Ambulatory Visit (INDEPENDENT_AMBULATORY_CARE_PROVIDER_SITE_OTHER): Payer: Managed Care, Other (non HMO)

## 2013-09-12 ENCOUNTER — Ambulatory Visit: Payer: Managed Care, Other (non HMO) | Admitting: Family Medicine

## 2013-09-12 DIAGNOSIS — E119 Type 2 diabetes mellitus without complications: Secondary | ICD-10-CM

## 2013-09-12 LAB — HEMOGLOBIN A1C: Hgb A1c MFr Bld: 6.4 % (ref 4.6–6.5)

## 2013-09-13 ENCOUNTER — Ambulatory Visit (INDEPENDENT_AMBULATORY_CARE_PROVIDER_SITE_OTHER): Payer: Managed Care, Other (non HMO) | Admitting: Family Medicine

## 2013-09-13 ENCOUNTER — Encounter: Payer: Self-pay | Admitting: Family Medicine

## 2013-09-13 VITALS — BP 104/72 | HR 77 | Temp 98.5°F | Wt 210.5 lb

## 2013-09-13 DIAGNOSIS — E119 Type 2 diabetes mellitus without complications: Secondary | ICD-10-CM

## 2013-09-13 DIAGNOSIS — H6690 Otitis media, unspecified, unspecified ear: Secondary | ICD-10-CM

## 2013-09-13 MED ORDER — AZITHROMYCIN 250 MG PO TABS: ORAL_TABLET | ORAL | Status: DC

## 2013-09-13 NOTE — Assessment & Plan Note (Signed)
Controlled, continue current meds.  F/u 6 months.  Labs dw pt.

## 2013-09-13 NOTE — Progress Notes (Signed)
Pre visit review using our clinic review tool, if applicable. No additional management support is needed unless otherwise documented below in the visit note.  Sx started about 1 week ago.  Febrile initially.  Flu positive at outside clinic, on tamflu now.  Now >48 hours w/o fever.  Now with L ear pain.  Some cough still.  Still using flonase.     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  D/w pt.    Diabetes:  Using medications without difficulties:yes Hypoglycemic episodes:no Hyperglycemic episodes:no Feet problems:no Blood Sugars averaging: usually ~100-110 eye exam within last year: due, dw pt.  A1c <7.   Meds, vitals, and allergies reviewed.   ROS: See HPI.  Otherwise negative.    GEN: nad, alert and oriented HEENT: mucous membranes moist, R TM wnl, L TM red and bulging.  Nasal exam wnl, OP with mild cobblestoning.  NECK: supple w/o LA CV: rrr. PULM: ctab, no inc wob ABD: soft, +bs EXT: no edema SKIN: no acute rash  Diabetic foot exam: Normal inspection No skin breakdown No calluses  Normal DP pulses Normal sensation to light touch and monofilament Nails normal

## 2013-09-13 NOTE — Patient Instructions (Signed)
Start the antibiotics today.  This should gradually improve.  [place] Monday's appointment.  Take care.   Don't change your regular meds.   Recheck labs before a physical in about 6 months.   Glad to see you.

## 2013-09-13 NOTE — Assessment & Plan Note (Signed)
Post flu.  Nontoxic.  Zmax, d/w pt.  Fu prn.  Should resolve.  Wouldn't likely need ENT referral yet, but if continues with frequent episodes then would be reasonable. D/w pt.

## 2013-09-14 ENCOUNTER — Telehealth: Payer: Self-pay

## 2013-09-14 NOTE — Telephone Encounter (Signed)
Relevant patient education assigned to patient using Emmi. ° °

## 2013-09-19 ENCOUNTER — Ambulatory Visit: Payer: Managed Care, Other (non HMO) | Admitting: Family Medicine

## 2013-12-02 ENCOUNTER — Ambulatory Visit (INDEPENDENT_AMBULATORY_CARE_PROVIDER_SITE_OTHER): Payer: Managed Care, Other (non HMO) | Admitting: Family Medicine

## 2013-12-02 ENCOUNTER — Encounter: Payer: Self-pay | Admitting: Family Medicine

## 2013-12-02 VITALS — BP 104/64 | HR 68 | Temp 98.1°F | Ht 68.0 in | Wt 219.5 lb

## 2013-12-02 DIAGNOSIS — M109 Gout, unspecified: Secondary | ICD-10-CM

## 2013-12-02 LAB — URIC ACID: Uric Acid, Serum: 6.9 mg/dL (ref 4.0–7.8)

## 2013-12-02 MED ORDER — COLCHICINE 0.6 MG PO TABS: ORAL_TABLET | ORAL | Status: DC

## 2013-12-02 NOTE — Progress Notes (Signed)
Pre visit review using our clinic review tool, if applicable. No additional management support is needed unless otherwise documented below in the visit note. 

## 2013-12-02 NOTE — Assessment & Plan Note (Signed)
Check uric acid. Start colchicine. Avoid triggers, info given.

## 2013-12-02 NOTE — Progress Notes (Signed)
   Subjective:    Patient ID: Bryan Doyle, male    DOB: Sep 29, 1970, 43 y.o.   MRN: [id number]  HPI   43 year old male with history of  DM and gout presents with new onset great toe pain in left foot. Started in last few days abut most pain in last 24 hours. 10/10 pain last night, heat,  Minimal redness. Soaking in epsom salts helped some, tylenol for pain.  No known triggers.  Last gout flare 2 years ago. No recent uric acid test, no on preventative med.     Review of Systems  Constitutional: Negative for fever.  HENT: Negative for ear pain.   Eyes: Negative for pain.  Respiratory: Negative for shortness of breath.   Cardiovascular: Negative for chest pain.       Objective:   Physical Exam  Constitutional: Vital signs are normal. He appears well-developed and well-nourished.  HENT:  Head: Normocephalic.  Right Ear: Hearing normal.  Left Ear: Hearing normal.  Nose: Nose normal.  Mouth/Throat: Oropharynx is clear and moist and mucous membranes are normal.  Neck: Trachea normal. Carotid bruit is not present. No mass and no thyromegaly present.  Cardiovascular: Normal rate, regular rhythm and normal pulses.  Exam reveals no gallop, no distant heart sounds and no friction rub.   No murmur heard. No peripheral edema  Pulmonary/Chest: Effort normal and breath sounds normal. No respiratory distress.  Musculoskeletal:       Right ankle: Normal. Achilles tendon normal.       Right foot: He exhibits decreased range of motion, tenderness and bony tenderness.       Feet:  Pain in right MTP joint, slight redness and warmth.  Skin: Skin is warm, dry and intact. No rash noted.  Psychiatric: He has a normal mood and affect. His speech is normal and behavior is normal. Thought content normal.          Assessment & Plan:

## 2013-12-02 NOTE — Patient Instructions (Signed)
Hold pravastatin while on colchicine. Start colchicine for gout. Stop at lab on way out. Call if not improving as expected in next few days.

## 2013-12-02 NOTE — Addendum Note (Signed)
Addended by: Marchia Bond on: 12/02/2013 10:16 AM   Modules accepted: Orders

## 2014-03-14 ENCOUNTER — Other Ambulatory Visit: Payer: Self-pay | Admitting: Family Medicine

## 2014-03-14 DIAGNOSIS — E119 Type 2 diabetes mellitus without complications: Secondary | ICD-10-CM

## 2014-03-16 ENCOUNTER — Other Ambulatory Visit (INDEPENDENT_AMBULATORY_CARE_PROVIDER_SITE_OTHER): Payer: Managed Care, Other (non HMO)

## 2014-03-16 ENCOUNTER — Encounter: Payer: Managed Care, Other (non HMO) | Admitting: Family Medicine

## 2014-03-16 DIAGNOSIS — E119 Type 2 diabetes mellitus without complications: Secondary | ICD-10-CM

## 2014-03-16 LAB — LIPID PANEL
CHOLESTEROL: 129 mg/dL (ref 0–200)
HDL: 24.7 mg/dL — ABNORMAL LOW (ref >39.00)
LDL Cholesterol: 78 mg/dL (ref 0–99)
NonHDL: 104.3
TRIGLYCERIDES: 134 mg/dL (ref 0.0–149.0)
Total CHOL/HDL Ratio: 5
VLDL: 26.8 mg/dL (ref 0.0–40.0)

## 2014-03-16 LAB — COMPREHENSIVE METABOLIC PANEL
ALK PHOS: 75 U/L (ref 39–117)
ALT: 46 U/L (ref 0–53)
AST: 29 U/L (ref 0–37)
Albumin: 4 g/dL (ref 3.5–5.2)
BUN: 11 mg/dL (ref 6–23)
CO2: 31 meq/L (ref 19–32)
Calcium: 9.3 mg/dL (ref 8.4–10.5)
Chloride: 103 mEq/L (ref 96–112)
Creatinine, Ser: 1.1 mg/dL (ref 0.4–1.5)
GFR: 76.22 mL/min (ref >60.00)
GLUCOSE: 102 mg/dL — AB (ref 70–99)
Potassium: 4 mEq/L (ref 3.5–5.1)
SODIUM: 139 meq/L (ref 135–145)
TOTAL PROTEIN: 6.8 g/dL (ref 6.0–8.3)
Total Bilirubin: 0.9 mg/dL (ref 0.2–1.2)

## 2014-03-16 LAB — HEMOGLOBIN A1C: HEMOGLOBIN A1C: 6.3 % (ref 4.6–6.5)

## 2014-03-23 ENCOUNTER — Encounter: Payer: Self-pay | Admitting: Family Medicine

## 2014-03-23 ENCOUNTER — Ambulatory Visit (INDEPENDENT_AMBULATORY_CARE_PROVIDER_SITE_OTHER): Payer: Managed Care, Other (non HMO) | Admitting: Family Medicine

## 2014-03-23 VITALS — BP 108/78 | HR 64 | Temp 98.1°F | Ht 67.75 in | Wt 215.8 lb

## 2014-03-23 DIAGNOSIS — E119 Type 2 diabetes mellitus without complications: Secondary | ICD-10-CM

## 2014-03-23 DIAGNOSIS — Z7189 Other specified counseling: Secondary | ICD-10-CM

## 2014-03-23 DIAGNOSIS — E781 Pure hyperglyceridemia: Secondary | ICD-10-CM

## 2014-03-23 DIAGNOSIS — Z0000 Encounter for general adult medical examination without abnormal findings: Secondary | ICD-10-CM

## 2014-03-23 MED ORDER — ONETOUCH ULTRASOFT LANCETS MISC: Status: DC

## 2014-03-23 MED ORDER — METFORMIN HCL 500 MG PO TABS: 500.0000 mg | ORAL_TABLET | Freq: Two times a day (BID) | ORAL | Status: DC

## 2014-03-23 MED ORDER — LISINOPRIL 2.5 MG PO TABS: 2.5000 mg | ORAL_TABLET | Freq: Every day | ORAL | Status: DC

## 2014-03-23 MED ORDER — GLUCOSE BLOOD VI STRP: ORAL_STRIP | Status: DC

## 2014-03-23 MED ORDER — PRAVASTATIN SODIUM 10 MG PO TABS: 10.0000 mg | ORAL_TABLET | Freq: Every day | ORAL | Status: DC

## 2014-03-23 NOTE — Patient Instructions (Signed)
Recheck A1c in 6 months before a visit.  Keep working on your weight.   I would get a flu shot each fall.   Take care.  Glad to see you.

## 2014-03-23 NOTE — Progress Notes (Signed)
Pre visit review using our clinic review tool, if applicable. No additional management support is needed unless otherwise documented below in the visit note.  CPE- See plan.  Routine anticipatory guidance given to patient.  See health maintenance.  Tetanus 2010 PNA 2014 Flu shot to be done at work.  Shingles not due.   Colon cancer screening not due.  PSA not due.  FH noted, but not early.  D/w pt.   Living will d/w pt.  Wife designated if incapacitated.   Diet and exercise d/w pt.  Down 4lbs.  Diet is good, he needs more exercise.    Diabetes:  Using medications without difficulties:yes Hypoglycemic episodes:no Hyperglycemic episodes:no Feet problems:no dec in sensation Blood Sugars averaging: 100-115 eye exam within last year: done 4 months ago A1c d/w pt.  Labs reviewed with patient.  On ACE.   Elevated Cholesterol: Using medications without problems:yes Muscle aches: no Diet compliance:see above Exercise: encouraged more Labs d/w pt.   Mother in law recently dx'd with dementia.  She is in [place].  They are getting HH to help with her care.    PMH and SH reviewed  Meds, vitals, and allergies reviewed.   ROS: See HPI.  Otherwise negative.    GEN: nad, alert and oriented HEENT: mucous membranes moist NECK: supple w/o LA CV: rrr. PULM: ctab, no inc wob ABD: soft, +bs, soft umbilical hernia.   EXT: no edema SKIN: no acute rash  Diabetic foot exam: Normal inspection No skin breakdown No calluses  Normal DP pulses Normal sensation to light touch and monofilament Nails normal

## 2014-03-24 DIAGNOSIS — Z7189 Other specified counseling: Secondary | ICD-10-CM | POA: Insufficient documentation

## 2014-03-24 NOTE — Assessment & Plan Note (Signed)
Routine anticipatory guidance given to patient.  See health maintenance.  Tetanus 2010 PNA 2014 Flu shot to be done at work.  Shingles not due.   Colon cancer screening not due.  PSA not due.  FH noted, but not early.  D/w pt.   Living will d/w pt.  Wife designated if incapacitated.   Diet and exercise d/w pt.  Down 4lbs.  Diet is good, he needs more exercise.

## 2014-03-24 NOTE — Assessment & Plan Note (Signed)
Controlled, labs d/w pt.  Continue work on diet and exercise.  No change in meds.

## 2014-03-24 NOTE — Assessment & Plan Note (Signed)
Controlled, A1c d/w pt.  With work on diet and exercise, he may get A1c <6 and off meds.  D/w pt.  He agrees.  Continue current meds for now.

## 2014-06-06 ENCOUNTER — Ambulatory Visit (INDEPENDENT_AMBULATORY_CARE_PROVIDER_SITE_OTHER): Payer: Managed Care, Other (non HMO) | Admitting: Internal Medicine

## 2014-06-06 ENCOUNTER — Encounter: Payer: Self-pay | Admitting: Internal Medicine

## 2014-06-06 VITALS — BP 118/76 | HR 73 | Temp 98.5°F | Wt 216.0 lb

## 2014-06-06 DIAGNOSIS — J069 Acute upper respiratory infection, unspecified: Secondary | ICD-10-CM

## 2014-06-06 MED ORDER — AZITHROMYCIN 250 MG PO TABS: ORAL_TABLET | ORAL | Status: DC

## 2014-06-06 NOTE — Patient Instructions (Signed)
Upper Respiratory Infection, Adult An upper respiratory infection (URI) is also sometimes known as the common cold. The upper respiratory tract includes the nose, sinuses, throat, trachea, and bronchi. Bronchi are the airways leading to the lungs. Most people improve within 1 week, but symptoms can last up to 2 weeks. A residual cough may last even longer.  CAUSES Many different viruses can infect the tissues lining the upper respiratory tract. The tissues become irritated and inflamed and often become very moist. Mucus production is also common. A cold is contagious. You can easily spread the virus to others by oral contact. This includes kissing, sharing a glass, coughing, or sneezing. Touching your mouth or nose and then touching a surface, which is then touched by another person, can also spread the virus. SYMPTOMS  Symptoms typically develop 1 to 3 days after you come in contact with a cold virus. Symptoms vary from person to person. They may include:  Runny nose.  Sneezing.  Nasal congestion.  Sinus irritation.  Sore throat.  Loss of voice (laryngitis).  Cough.  Fatigue.  Muscle aches.  Loss of appetite.  Headache.  Low-grade fever. DIAGNOSIS  You might diagnose your own cold based on familiar symptoms, since most people get a cold 2 to 3 times a year. Your caregiver can confirm this based on your exam. Most importantly, your caregiver can check that your symptoms are not due to another disease such as strep throat, sinusitis, pneumonia, asthma, or epiglottitis. Blood tests, throat tests, and X-rays are not necessary to diagnose a common cold, but they may sometimes be helpful in excluding other more serious diseases. Your caregiver will decide if any further tests are required. RISKS AND COMPLICATIONS  You may be at risk for a more severe case of the common cold if you smoke cigarettes, have chronic heart disease (such as heart failure) or lung disease (such as asthma), or if  you have a weakened immune system. The very young and very old are also at risk for more serious infections. Bacterial sinusitis, middle ear infections, and bacterial pneumonia can complicate the common cold. The common cold can worsen asthma and chronic obstructive pulmonary disease (COPD). Sometimes, these complications can require emergency medical care and may be life-threatening. PREVENTION  The best way to protect against getting a cold is to practice good hygiene. Avoid oral or hand contact with people with cold symptoms. Wash your hands often if contact occurs. There is no clear evidence that vitamin C, vitamin E, echinacea, or exercise reduces the chance of developing a cold. However, it is always recommended to get plenty of rest and practice good nutrition. TREATMENT  Treatment is directed at relieving symptoms. There is no cure. Antibiotics are not effective, because the infection is caused by a virus, not by bacteria. Treatment may include:  Increased fluid intake. Sports drinks offer valuable electrolytes, sugars, and fluids.  Breathing heated mist or steam (vaporizer or shower).  Eating chicken soup or other clear broths, and maintaining good nutrition.  Getting plenty of rest.  Using gargles or lozenges for comfort.  Controlling fevers with ibuprofen or acetaminophen as directed by your caregiver.  Increasing usage of your inhaler if you have asthma. Zinc gel and zinc lozenges, taken in the first 24 hours of the common cold, can shorten the duration and lessen the severity of symptoms. Pain medicines may help with fever, muscle aches, and throat pain. A variety of non-prescription medicines are available to treat congestion and runny nose. Your caregiver   can make recommendations and may suggest nasal or lung inhalers for other symptoms.  HOME CARE INSTRUCTIONS   Only take over-the-counter or prescription medicines for pain, discomfort, or fever as directed by your  caregiver.  Use a warm mist humidifier or inhale steam from a shower to increase air moisture. This may keep secretions moist and make it easier to breathe.  Drink enough water and fluids to keep your urine clear or pale yellow.  Rest as needed.  Return to work when your temperature has returned to normal or as your caregiver advises. You may need to stay home longer to avoid infecting others. You can also use a face mask and careful hand washing to prevent spread of the virus. SEEK MEDICAL CARE IF:   After the first few days, you feel you are getting worse rather than better.  You need your caregiver's advice about medicines to control symptoms.  You develop chills, worsening shortness of breath, or brown or red sputum. These may be signs of pneumonia.  You develop yellow or brown nasal discharge or pain in the face, especially when you bend forward. These may be signs of sinusitis.  You develop a fever, swollen neck glands, pain with swallowing, or white areas in the back of your throat. These may be signs of strep throat. SEEK IMMEDIATE MEDICAL CARE IF:   You have a fever.  You develop severe or persistent headache, ear pain, sinus pain, or chest pain.  You develop wheezing, a prolonged cough, cough up blood, or have a change in your usual mucus (if you have chronic lung disease).  You develop sore muscles or a stiff neck. Document Released: 12/17/2000 Document Revised: 09/15/2011 Document Reviewed: 09/28/2013 Exit[employer] Patient Information 2015 [employer]. This information is not intended to replace advice given to you by your health care provider. Make sure you discuss any questions you have with your health care provider.  

## 2014-06-06 NOTE — Progress Notes (Signed)
Pre visit review using our clinic review tool, if applicable. No additional management support is needed unless otherwise documented below in the visit note. 

## 2014-06-06 NOTE — Progress Notes (Signed)
HPI  Pt presents to the clinic today with c/o cough, fever and nasal congestion. He reports this started 4-5 days ago. He is blowing green mucous out of his notse. The cough is unproductive. He has run fevers up to 100.7. He has tried Ibuprofen, Tylenol, Mucinex and Flonase without much relief. He does have a history of allergies. He has had sick contacts.  Review of Systems      Past Medical History  Diagnosis Date  . Allergy   . Diabetes mellitus   . Gout     Family History  Problem Relation Age of Onset  . COPD Mother   . Cancer Mother     breast  . Diabetes Father   . Hyperlipidemia Father   . Hypertension Father   . Cancer Paternal Aunt   . Diabetes Maternal Grandfather   . Prostate cancer Maternal Grandfather   . Colon cancer Neg Hx     History   Social History  . Marital Status: Married    Spouse Name: N/A    Number of Children: 1  . Years of Education: N/A   Occupational History  . 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 Smoking status: Never Smoker   . Smokeless tobacco: Never Used  . Alcohol Use: 0.0 oz/week    0.5  drink(s) per week     Comment: rarely  . Drug Use: No  . Sexual Activity: Yes   Other Topics Concern  . Not on file   Social History Narrative   Works at [employer], 2006   1 daughter    Allergies  Allergen Reactions  . Penicillins     REACTION: u/k- was told in infancy to avoid  . Sulfa Antibiotics Rash    Severe rash and fever     Constitutional: Positive headache, fatigue and fever. Denies abrupt weight changes.  HEENT:  Positive nasal congestion, sore throat. Denies eye redness, eye pain, pressure behind the eyes, facial pain, ear pain, ringing in the ears, wax buildup, runny nose or bloody nose. Respiratory: Positive cough. Denies difficulty breathing or shortness of breath.  Cardiovascular: Denies chest pain, chest tightness, palpitations or swelling in the hands or feet.   No other  specific complaints in a complete review of systems (except as listed in HPI above).  Objective:   BP 118/76 mmHg  Pulse 73  Temp(Src) 98.5 F (36.9 C) (Oral)  Wt 216 lb (97.977 kg)  SpO2 98% Wt Readings from Last 3 Encounters:  06/06/14 216 lb (97.977 kg)  03/23/14 215 lb 12 oz (97.864 kg)  12/02/13 219 lb 8 oz (99.565 kg)     General: Appears his stated age, ill appearing in NAD. HEENT: Head: normal shape and size; Ears: Tm's gray and intact, normal light reflex; Nose: mucosa pink and moist, septum midline; Throat/Mouth:  Teeth present, mucosa erythematous and moist, no exudate noted, no lesions or ulcerations noted.   Cardiovascular: Normal rate and rhythm. S1,S2 noted.  No murmur, rubs or gallops noted.  Pulmonary/Chest: Normal effort and positive vesicular breath sounds. No respiratory distress. No wheezes, rales or ronchi noted.      Assessment & Plan:   Upper Respiratory Infection:  Get some rest and drink plenty of water Do salt water gargles for the sore throat eRx for Azithromax x 5 days Delsym OTC for cough  RTC as needed or if symptoms persist.

## 2014-08-01 ENCOUNTER — Ambulatory Visit (INDEPENDENT_AMBULATORY_CARE_PROVIDER_SITE_OTHER): Payer: Managed Care, Other (non HMO) | Admitting: Family Medicine

## 2014-08-01 ENCOUNTER — Encounter: Payer: Self-pay | Admitting: Family Medicine

## 2014-08-01 VITALS — BP 110/76 | HR 76 | Temp 98.6°F | Wt 220.0 lb

## 2014-08-01 DIAGNOSIS — R059 Cough, unspecified: Secondary | ICD-10-CM

## 2014-08-01 DIAGNOSIS — R05 Cough: Secondary | ICD-10-CM

## 2014-08-01 DIAGNOSIS — J069 Acute upper respiratory infection, unspecified: Secondary | ICD-10-CM

## 2014-08-01 MED ORDER — BENZONATATE 200 MG PO CAPS: 200.0000 mg | ORAL_CAPSULE | Freq: Three times a day (TID) | ORAL | Status: DC | PRN

## 2014-08-01 MED ORDER — AZITHROMYCIN 250 MG PO TABS: ORAL_TABLET | ORAL | Status: DC

## 2014-08-01 NOTE — Progress Notes (Signed)
Pre visit review using our clinic review tool, if applicable. No additional management support is needed unless otherwise documented below in the visit note.  Sx started to get worse about 7-10 days ago.  He had been sick over the holidays, worse in the last ~10 days.  Still coughing, post nasal gtt.  No ear pain.  No facial pain.  No sputum usually, occ some sputum.  No fevers.  Not SOB.  No wheeze known.  Has used neti pot prev. No myalgias.  Cough is annoying.  He doesn't feel awful but doesn't feel great.   On baseline meds. Tried guaifenesin.  Prev sinus pressure better now.    Meds, vitals, and allergies reviewed.   ROS: See HPI.  Otherwise, noncontributory.  GEN: nad, alert and oriented HEENT: mucous membranes moist, tm w/o erythema, nasal exam w/o erythema, clear discharge noted,  OP with cobblestoning NECK: supple w/o LA CV: rrr.   PULM: ctab, no inc wob EXT: no edema SKIN: no acute rash

## 2014-08-01 NOTE — Patient Instructions (Signed)
Try tessalon for the cough.  Hold the antibiotics for now, if not better in a few days then start that.  Take care. Try to get some rest.  Glad to see you.

## 2014-08-02 DIAGNOSIS — R05 Cough: Secondary | ICD-10-CM | POA: Insufficient documentation

## 2014-08-02 DIAGNOSIS — R059 Cough, unspecified: Secondary | ICD-10-CM | POA: Insufficient documentation

## 2014-08-02 NOTE — Assessment & Plan Note (Signed)
Nontoxic, can try tessalon for the cough. Hold zmax for now, if not better in a few days then start that.  He agrees. F/u prn.

## 2014-09-10 ENCOUNTER — Other Ambulatory Visit: Payer: Self-pay | Admitting: Family Medicine

## 2014-09-10 DIAGNOSIS — E119 Type 2 diabetes mellitus without complications: Secondary | ICD-10-CM

## 2014-09-12 ENCOUNTER — Other Ambulatory Visit (INDEPENDENT_AMBULATORY_CARE_PROVIDER_SITE_OTHER): Payer: Managed Care, Other (non HMO)

## 2014-09-12 DIAGNOSIS — E119 Type 2 diabetes mellitus without complications: Secondary | ICD-10-CM

## 2014-09-12 LAB — HEMOGLOBIN A1C: Hgb A1c MFr Bld: 6.3 % (ref 4.6–6.5)

## 2014-09-15 ENCOUNTER — Ambulatory Visit (INDEPENDENT_AMBULATORY_CARE_PROVIDER_SITE_OTHER): Payer: Managed Care, Other (non HMO) | Admitting: Family Medicine

## 2014-09-15 ENCOUNTER — Encounter: Payer: Self-pay | Admitting: Family Medicine

## 2014-09-15 VITALS — BP 116/70 | HR 82 | Temp 97.9°F | Wt 216.5 lb

## 2014-09-15 DIAGNOSIS — E119 Type 2 diabetes mellitus without complications: Secondary | ICD-10-CM

## 2014-09-15 NOTE — Assessment & Plan Note (Signed)
Controlled, labs d/w pt.  Continue metformin as is.  D/w pt about diet and exercise. >15 minutes spent in face to face time with patient, >50% spent in counselling or coordination of care.

## 2014-09-15 NOTE — Patient Instructions (Signed)
Don't change your meds.  Goal= more exercise.  Recheck labs before a physical in about 6 months.  Glad to see you.  Take care.

## 2014-09-15 NOTE — Progress Notes (Signed)
Pre visit review using our clinic review tool, if applicable. No additional management support is needed unless otherwise documented below in the visit note.  Diabetes:  Using medications without difficulties:yes Hypoglycemic episodes:no Hyperglycemic episodes:no Feet problems:no Blood Sugars averaging: usually ~95-115 eye exam within last year: yes, due for f/u later this year A1c d/w pt.  Stable at 6.3.   Diet and exercise d/w pt.  "not good enough."  Doing better with diet.  Needs more exercise.  Encouraged more exercise.    Meds, vitals, and allergies reviewed.   ROS: See HPI.  Otherwise negative.    GEN: nad, alert and oriented HEENT: mucous membranes moist NECK: supple w/o LA CV: rrr. PULM: ctab, no inc wob ABD: soft, +bs EXT: no edema SKIN: no acute rash  Diabetic foot exam: Normal inspection No skin breakdown No calluses  Normal DP pulses Normal sensation to light touch and monofilament Nails normal

## 2015-01-29 ENCOUNTER — Encounter: Payer: Self-pay | Admitting: Family Medicine

## 2015-01-29 ENCOUNTER — Ambulatory Visit (INDEPENDENT_AMBULATORY_CARE_PROVIDER_SITE_OTHER): Payer: Managed Care, Other (non HMO) | Admitting: Family Medicine

## 2015-01-29 VITALS — BP 104/64 | HR 68 | Temp 98.5°F | Wt 212.0 lb

## 2015-01-29 DIAGNOSIS — J189 Pneumonia, unspecified organism: Secondary | ICD-10-CM | POA: Diagnosis not present

## 2015-01-29 NOTE — Progress Notes (Signed)
Pre visit review using our clinic review tool, if applicable. No additional management support is needed unless otherwise documented below in the visit note.  Was on vacation in [place], 01/24/15.  Started to feel poorly, congested, run down.  He didn't think he had a fever.  He was too fatigued to do routine activities.  Then noted a 102.5 fever.  Took some OTC meds and went to bed.  Got up, still had a fever that night.  Next day went for eval at [facility].  CXR with PNA.  Started on zmax in the meantime.  Clearly improved from prev.  Still with cough, but still tired (though improved).  No sputum today, did have some yesterday.  No fevers now.  On zmax.    Meds, vitals, and allergies reviewed.   ROS: See HPI.  Otherwise, noncontributory.  GEN: nad, alert and oriented HEENT: mucous membranes moist NECK: supple w/o LA CV: rrr.  PULM: ctab, no inc wob, occ cough noted ABD: soft, +bs EXT: no edema SKIN: no acute rash  Old disc noted, reviewed with likely small R sided PNA noted on cxr.  Disc given back to patient.

## 2015-01-29 NOTE — Patient Instructions (Signed)
Finish the antibiotics and recheck CXR in about 1 month.   Take care.  Glad to see you.

## 2015-01-29 NOTE — Assessment & Plan Note (Signed)
Ctab, much improved.  D/w pt.  Finish abx.  Recheck CXR in about 4-6 weeks, okay to do at the lab visit for f/u labs.  He agrees.  Call back as needed. Okay for outpatient f/u.

## 2015-02-28 ENCOUNTER — Other Ambulatory Visit: Payer: Self-pay | Admitting: Family Medicine

## 2015-03-05 LAB — HM DIABETES EYE EXAM

## 2015-03-07 ENCOUNTER — Encounter: Payer: Self-pay | Admitting: Family Medicine

## 2015-03-18 ENCOUNTER — Other Ambulatory Visit: Payer: Self-pay | Admitting: Family Medicine

## 2015-03-18 DIAGNOSIS — M109 Gout, unspecified: Secondary | ICD-10-CM

## 2015-03-18 DIAGNOSIS — E119 Type 2 diabetes mellitus without complications: Secondary | ICD-10-CM

## 2015-03-20 ENCOUNTER — Ambulatory Visit (INDEPENDENT_AMBULATORY_CARE_PROVIDER_SITE_OTHER)
Admission: RE | Admit: 2015-03-20 | Discharge: 2015-03-20 | Disposition: A | Discharge status: Home / Self Care | Payer: Managed Care, Other (non HMO) | Source: Ambulatory Visit | Attending: Family Medicine | Admitting: Family Medicine

## 2015-03-20 ENCOUNTER — Other Ambulatory Visit (INDEPENDENT_AMBULATORY_CARE_PROVIDER_SITE_OTHER): Payer: Managed Care, Other (non HMO)

## 2015-03-20 DIAGNOSIS — M109 Gout, unspecified: Secondary | ICD-10-CM

## 2015-03-20 DIAGNOSIS — E119 Type 2 diabetes mellitus without complications: Secondary | ICD-10-CM

## 2015-03-20 DIAGNOSIS — J189 Pneumonia, unspecified organism: Secondary | ICD-10-CM | POA: Diagnosis not present

## 2015-03-20 LAB — COMPREHENSIVE METABOLIC PANEL
ALBUMIN: 4 g/dL (ref 3.5–5.2)
ALT: 38 U/L (ref 0–53)
AST: 23 U/L (ref 0–37)
Alkaline Phosphatase: 85 U/L (ref 39–117)
BUN: 15 mg/dL (ref 6–23)
CHLORIDE: 106 meq/L (ref 96–112)
CO2: 29 mEq/L (ref 19–32)
CREATININE: 0.95 mg/dL (ref 0.40–1.50)
Calcium: 9 mg/dL (ref 8.4–10.5)
GFR: 91.73 mL/min (ref >60.00)
GLUCOSE: 97 mg/dL (ref 70–99)
Potassium: 4 mEq/L (ref 3.5–5.1)
SODIUM: 142 meq/L (ref 135–145)
Total Bilirubin: 0.6 mg/dL (ref 0.2–1.2)
Total Protein: 6.3 g/dL (ref 6.0–8.3)

## 2015-03-20 LAB — HEMOGLOBIN A1C: HEMOGLOBIN A1C: 6 % (ref 4.6–6.5)

## 2015-03-20 LAB — LIPID PANEL
CHOL/HDL RATIO: 4
CHOLESTEROL: 122 mg/dL (ref 0–200)
HDL: 30.2 mg/dL — ABNORMAL LOW (ref >39.00)
LDL CALC: 69 mg/dL (ref 0–99)
NONHDL: 91.97
Triglycerides: 117 mg/dL (ref 0.0–149.0)
VLDL: 23.4 mg/dL (ref 0.0–40.0)

## 2015-03-20 LAB — URIC ACID: URIC ACID, SERUM: 7.6 mg/dL (ref 4.0–7.8)

## 2015-03-23 ENCOUNTER — Ambulatory Visit (INDEPENDENT_AMBULATORY_CARE_PROVIDER_SITE_OTHER): Payer: Managed Care, Other (non HMO) | Admitting: Family Medicine

## 2015-03-23 ENCOUNTER — Encounter: Payer: Self-pay | Admitting: Family Medicine

## 2015-03-23 VITALS — BP 118/72 | HR 59 | Temp 97.8°F | Ht 67.25 in | Wt 213.5 lb

## 2015-03-23 DIAGNOSIS — Z0000 Encounter for general adult medical examination without abnormal findings: Secondary | ICD-10-CM | POA: Diagnosis not present

## 2015-03-23 DIAGNOSIS — Z119 Encounter for screening for infectious and parasitic diseases, unspecified: Secondary | ICD-10-CM

## 2015-03-23 DIAGNOSIS — E119 Type 2 diabetes mellitus without complications: Secondary | ICD-10-CM | POA: Diagnosis not present

## 2015-03-23 DIAGNOSIS — Z23 Encounter for immunization: Secondary | ICD-10-CM | POA: Diagnosis not present

## 2015-03-23 DIAGNOSIS — J189 Pneumonia, unspecified organism: Secondary | ICD-10-CM

## 2015-03-23 DIAGNOSIS — M722 Plantar fascial fibromatosis: Secondary | ICD-10-CM

## 2015-03-23 DIAGNOSIS — J3489 Other specified disorders of nose and nasal sinuses: Secondary | ICD-10-CM

## 2015-03-23 DIAGNOSIS — J309 Allergic rhinitis, unspecified: Secondary | ICD-10-CM

## 2015-03-23 DIAGNOSIS — E781 Pure hyperglyceridemia: Secondary | ICD-10-CM | POA: Diagnosis not present

## 2015-03-23 MED ORDER — PRAVASTATIN SODIUM 10 MG PO TABS: 10.0000 mg | ORAL_TABLET | Freq: Every day | ORAL | Status: DC

## 2015-03-23 MED ORDER — LISINOPRIL 2.5 MG PO TABS: 2.5000 mg | ORAL_TABLET | Freq: Every day | ORAL | Status: DC

## 2015-03-23 MED ORDER — METFORMIN HCL 500 MG PO TABS: 500.0000 mg | ORAL_TABLET | Freq: Every day | ORAL | Status: DC

## 2015-03-23 NOTE — Patient Instructions (Addendum)
Cut the metformin back to once a day and we can recheck your labs before a visit in about 6 months.   Bryan Doyle will call about your referral. Stretch your feet each AM before getting out of bed.  You can roll a cold bottle on your arch also.  Likely plantar fasciitis.   Take care.  Glad to see you.

## 2015-03-23 NOTE — Progress Notes (Signed)
Pre visit review using our clinic review tool, if applicable. No additional management support is needed unless otherwise documented below in the visit note.  CPE- See plan.  Routine anticipatory guidance given to patient.  See health maintenance. Tetanus 2010 Flu 2016 PNA and shingles not due.  Pt opts in for HIV screening with next set of labs.  D/w pt re: routine screening.   Colon and prostate CA screening not due.   Living will d/w pt.  Wife designated if patient were incapacitated.   Diet and exercise d/w pt.  Encouraged both.  He would like to work on exercise more.  D/w pt.    Prev with CAP.  CXR resolved.  He feels better.  No cough.  No fevers.  CXR reviewed with patient.   Persistently with sinus sx, more headaches with the flares.  He's having them monthly now.  He is miserable with the flares.  He tried flonase w/o any help.  He wanted to talk to ENT.    Diabetes:  Using medications without difficulties:yes Hypoglycemic episodes: rare sx, only with prolonged fasting.  Hyperglycemic episodes: no Feet problems: not other than some heel pain.  Pain with first step in the AM, better as the day goes on.  B heel pain.   Blood Sugars averaging: 90-110 eye exam within last year:yes  Elevated Cholesterol: Using medications without problems: yes Muscle aches: no Diet compliance: see above.  Exercise: see above.  Labs d/w pt.   No gout flares. Labs d/w pt.  He does have foot pain with 1st steps in the Am, near the heels B, plantar side of foot.    PMH and SH reviewed.   Vital signs, Meds and allergies reviewed.  ROS: See HPI.  Otherwise nontributory.   GEN: nad, alert and oriented HEENT: mucous membranes moist NECK: supple w/o LA CV: rrr PULM: ctab, no inc wob ABD: soft, +bs EXT: no edema SKIN: no acute rash  Diabetic foot exam: Normal inspection No skin breakdown No calluses  Normal DP pulses Normal sensation to light tough and monofilament Nails normal Origin  of B plantar fascia ttp

## 2015-03-26 NOTE — Assessment & Plan Note (Signed)
No recent sx.  D/w pt.

## 2015-03-26 NOTE — Assessment & Plan Note (Signed)
D/w pt about anatomy, stretching, OTC inserts.  Fu prn.  He agrees.

## 2015-03-26 NOTE — Assessment & Plan Note (Signed)
Resolved.  No sx.  cxr clear.

## 2015-03-26 NOTE — Assessment & Plan Note (Signed)
Refer to ENT.  He agrees.

## 2015-03-26 NOTE — Assessment & Plan Note (Signed)
Routine anticipatory guidance given to patient.  See health maintenance. Tetanus 2010 Flu 2016 PNA and shingles not due.  Pt opts in for HIV screening with next set of labs.  D/w pt re: routine screening.   Colon and prostate CA screening not due.   Living will d/w pt.  Wife designated if patient were incapacitated.   Diet and exercise d/w pt.  Encouraged both.  He would like to work on exercise more.  D/w pt.

## 2015-03-26 NOTE — Assessment & Plan Note (Signed)
Improved, with weight loss and diet/exercise he may be able to resolve this.  Okay to taper metformin in the meantime.  He agrees.  Continue checking sugar at home.

## 2015-03-26 NOTE — Assessment & Plan Note (Signed)
Controlled, continue as is with med, needs to continue to work on diet and exercise.

## 2015-05-09 ENCOUNTER — Encounter: Payer: Self-pay | Admitting: *Deleted

## 2015-05-15 NOTE — Discharge Instructions (Signed)
[facility] °[facility] °ENDOSCOPIC SINUS SURGERY ° [facility] ° °What is Functional Endoscopic Sinus Surgery? ° The Surgery involves making the natural openings of the sinuses larger by removing the bony partitions that separate the sinuses from the nasal cavity.  The natural sinus lining is preserved as much as possible to allow the sinuses to resume normal function after the surgery.  In some patients nasal polyps (excessively swollen lining of the sinuses) may be removed to relieve obstruction of the sinus openings.  The surgery is performed through the nose using lighted scopes, which eliminates the need for incisions on the face.  A septoplasty is a different procedure which is sometimes performed with sinus surgery.  It involves straightening the boy partition that separates the two sides of your nose.  A crooked or deviated septum may need repair if is obstructing the sinuses or nasal airflow.  Turbinate reduction is also often performed during sinus surgery.  The turbinates are bony proturberances from the side walls of the nose which swell and can obstruct the nose in patients with sinus and allergy problems.  Their size can be surgically reduced to help relieve nasal obstruction. ° °What Can Sinus Surgery Do For Me? ° Sinus surgery can reduce the frequency of sinus infections requiring antibiotic treatment.  This can provide improvement in nasal congestion, post-nasal drainage, facial pressure and nasal obstruction.  Surgery will NOT prevent you from ever having an infection again, so it usually only for patients who get infections 4 or more times yearly requiring antibiotics, or for infections that do not clear with antibiotics.  It will not cure nasal allergies, so patients with allergies may still require medication to treat their allergies after surgery. Surgery may improve headaches related to sinusitis, however, some people will continue to  require medication to control sinus headaches related to allergies.  Surgery will do nothing for other forms of headache (migraine, tension or cluster). ° °What Are the Risks of Endoscopic Sinus Surgery? ° Current techniques allow surgery to be performed safely with little risk, however, there are rare complications that patients should be aware of.  Because the sinuses are located around the eyes, there is risk of eye injury, including blindness, though again, this would be quite rare. This is usually a result of bleeding behind the eye during surgery, which puts the vision oat risk, though there are treatments to protect the vision and prevent permanent disrupted by surgery causing a leak of the spinal fluid that surrounds the brain.  More serious complications would include bleeding inside the brain cavity or damage to the brain.  Again, all of these complications are uncommon, and spinal fluid leaks can be safely managed surgically if they occur.  The most common complication of sinus surgery is bleeding from the nose, which may require packing or cauterization of the nose.  Continued sinus have polyps may experience recurrence of the polyps requiring revision surgery.  Alterations of sense of smell or injury to the tear ducts are also rare complications.  ° °What is the Surgery Like, and what is the Recovery? ° The Surgery usually takes a couple of hours to perform, and is usually performed under a general anesthetic (completely asleep).  Patients are usually discharged home after a couple of hours.  Sometimes during surgery it is necessary to pack the nose to control bleeding, and the packing is left in place for 24 - 48 hours, and removed by your surgeon.    If a septoplasty was performed during the procedure, there is often a splint placed which must be removed after 5-7 days.   °Discomfort: Pain is usually mild to moderate, and can be controlled by prescription pain medication or acetaminophen (Tylenol).   Aspirin, Ibuprofen (Advil, Motrin), or Naprosyn (Aleve) should be avoided, as they can cause increased bleeding.  Most patients feel sinus pressure like they have a bad head cold for several days.  Sleeping with your head elevated can help reduce swelling and facial pressure, as can ice packs over the face.  A humidifier may be helpful to keep the mucous and blood from drying in the nose.  ° °Diet: There are no specific diet restrictions, however, you should generally start with clear liquids and a light diet of bland foods because the anesthetic can cause some nausea.  Advance your diet depending on how your stomach feels.  Taking your pain medication with food will often help reduce stomach upset which pain medications can cause. ° °Nasal Saline Irrigation: It is important to remove blood clots and dried mucous from the nose as it is healing.  This is done by having you irrigate the nose at least 3 - 4 times daily with a salt water solution.  We recommend using NeilMed Sinus Rinse (available at the drug store).  Fill the squeeze bottle with the solution, bend over a sink, and insert the tip of the squeeze bottle into the nose ½ of an inch.  Point the tip of the squeeze bottle towards the inside corner of the eye on the same side your irrigating.  Squeeze the bottle and gently irrigate the nose.  If you bend forward as you do this, most of the fluid will flow back out of the nose, instead of down your throat.   The solution should be warm, near body temperature, when you irrigate.   Each time you irrigate, you should use a full squeeze bottle.  ° °Note that if you are instructed to use Nasal Steroid Sprays at any time after your surgery, irrigate with saline BEFORE using the steroid spray, so you do not wash it all out of the nose. °Another product, Nasal Saline Gel (such as AYR Nasal Saline Gel) can be applied in each nostril 3 - 4 times daily to moisture the nose and reduce scabbing or crusting. ° °Bleeding:   Bloody drainage from the nose can be expected for several days, and patients are instructed to irrigate their nose frequently with salt water to help remove mucous and blood clots.  The drainage may be dark red or brown, though some fresh blood may be seen intermittently, especially after irrigation.  Do not blow you nose, as bleeding may occur. If you must sneeze, keep your mouth open to allow air to escape through your mouth. ° °If heavy bleeding occurs: Irrigate the nose with saline to rinse out clots, then spray the nose 3 - 4 times with Afrin Nasal Decongestant Spray.  The spray will constrict the blood vessels to slow bleeding.  Pinch the lower half of your nose shut to apply pressure, and lay down with your head elevated.  Ice packs over the nose may help as well. If bleeding persists despite these measures, you should notify your doctor.  Do not use the Afrin routinely to control nasal congestion after surgery, as it can result in worsening congestion and may affect healing.  ° ° ° °Activity: Return to work varies among patients. Most patients will be   out of work at least 5 - 7 days to recover.  Patient may return to work after they are off of narcotic pain medication, and feeling well enough to perform the functions of their job.  Patients must avoid heavy lifting (over 10 pounds) or strenuous physical for 2 weeks after surgery, so your employer may need to assign you to light duty, or keep you out of work longer if light duty is not possible.  NOTE: you should not drive, operate dangerous machinery, do any mentally demanding tasks or make any important legal or financial decisions while on narcotic pain medication and recovering from the general anesthetic.  °  °Call Your Doctor Immediately if You Have Any of the Following: °1. Bleeding that you cannot control with the above measures °2. Loss of vision, double vision, bulging of the eye or black eyes. °3. Fever over 101 degrees °4. Neck stiffness with  severe headache, fever, nausea and change in mental state. °You are always encourage to call anytime with concerns, however, please call with requests for pain medication refills during office hours. ° °Office Endoscopy: During follow-up visits your doctor will remove any packing or splints that may have been placed and evaluate and clean your sinuses endoscopically.  Topical anesthetic will be used to make this as comfortable as possible, though you may want to take your pain medication prior to the visit.  How often this will need to be done varies from patient to patient.  After complete recovery from the surgery, you may need follow-up endoscopy from time to time, particularly if there is concern of recurrent infection or nasal polyps. ° °General Anesthesia, Adult, Care After °Refer to this sheet in the next few weeks. These instructions provide you with information on caring for yourself after your procedure. Your health care provider may also give you more specific instructions. Your treatment has been planned according to current medical practices, but problems sometimes occur. Call your health care provider if you have any problems or questions after your procedure. °WHAT TO EXPECT AFTER THE PROCEDURE °After the procedure, it is typical to experience: °· Sleepiness. °· Nausea and vomiting. °HOME CARE INSTRUCTIONS °· For the first 24 hours after general anesthesia: °¨ Have a responsible person with you. °¨ Do not drive a car. If you are alone, do not take public transportation. °¨ Do not drink alcohol. °¨ Do not take medicine that has not been prescribed by your health care provider. °¨ Do not sign important papers or make important decisions. °¨ You may resume a normal diet and activities as directed by your health care provider. °· Change bandages (dressings) as directed. °· If you have questions or problems that seem related to general anesthesia, call the hospital and ask for the anesthetist or  anesthesiologist on call. °SEEK MEDICAL CARE IF: °· You have nausea and vomiting that continue the day after anesthesia. °· You develop a rash. °SEEK IMMEDIATE MEDICAL CARE IF:  °· You have difficulty breathing. °· You have chest pain. °· You have any allergic problems. °  °This information is not intended to replace advice given to you by your health care provider. Make sure you discuss any questions you have with your health care provider. °  °Document Released: 09/29/2000 Document Revised: 07/14/2014 Document Reviewed: 10/22/2011 °Elsevier Interactive Patient Education ©2016 [employer] ° °

## 2015-05-16 ENCOUNTER — Ambulatory Visit
Admission: RE | Admit: 2015-05-16 | Discharge: 2015-05-16 | Disposition: A | Discharge status: Home / Self Care | Payer: Managed Care, Other (non HMO) | Source: Ambulatory Visit | Attending: Otolaryngology | Admitting: Otolaryngology

## 2015-05-16 ENCOUNTER — Ambulatory Visit: Payer: Managed Care, Other (non HMO) | Admitting: Anesthesiology

## 2015-05-16 ENCOUNTER — Encounter
Admission: RE | Disposition: A | Discharge status: Home / Self Care | Payer: Self-pay | Source: Ambulatory Visit | Attending: Otolaryngology

## 2015-05-16 DIAGNOSIS — Z882 Allergy status to sulfonamides status: Secondary | ICD-10-CM | POA: Diagnosis not present

## 2015-05-16 DIAGNOSIS — Z880 Allergy status to penicillin: Secondary | ICD-10-CM | POA: Diagnosis not present

## 2015-05-16 DIAGNOSIS — E119 Type 2 diabetes mellitus without complications: Secondary | ICD-10-CM | POA: Insufficient documentation

## 2015-05-16 DIAGNOSIS — J342 Deviated nasal septum: Secondary | ICD-10-CM | POA: Insufficient documentation

## 2015-05-16 DIAGNOSIS — J343 Hypertrophy of nasal turbinates: Secondary | ICD-10-CM | POA: Diagnosis not present

## 2015-05-16 HISTORY — PX: TURBINATE REDUCTION: SHX6157

## 2015-05-16 HISTORY — DX: Unspecified asthma, uncomplicated: J45.909

## 2015-05-16 HISTORY — DX: Headache: R51

## 2015-05-16 HISTORY — PX: MAXILLARY ANTROSTOMY: SHX2003

## 2015-05-16 HISTORY — PX: ETHMOIDECTOMY: SHX5197

## 2015-05-16 HISTORY — PX: IMAGE GUIDED SINUS SURGERY: SHX6570

## 2015-05-16 HISTORY — DX: Headache, unspecified: R51.9

## 2015-05-16 HISTORY — PX: SEPTOPLASTY: SHX2393

## 2015-05-16 LAB — GLUCOSE, CAPILLARY
GLUCOSE-CAPILLARY: 116 mg/dL — AB (ref 65–99)
Glucose-Capillary: 139 mg/dL — ABNORMAL HIGH (ref 65–99)

## 2015-05-16 SURGERY — SINUS SURGERY, WITH IMAGING GUIDANCE
Anesthesia: General | Laterality: Right | Wound class: Clean Contaminated

## 2015-05-16 MED ORDER — ROCURONIUM BROMIDE 100 MG/10ML IV SOLN
INTRAVENOUS | Status: DC | PRN
  Administered 2015-05-16: 30 mg via INTRAVENOUS

## 2015-05-16 MED ORDER — FENTANYL CITRATE (PF) 100 MCG/2ML IJ SOLN
INTRAMUSCULAR | Status: DC | PRN
  Administered 2015-05-16: 100 ug via INTRAVENOUS

## 2015-05-16 MED ORDER — OXYCODONE HCL 5 MG/5ML PO SOLN
5.0000 mg | Freq: Once | ORAL | Status: AC | PRN
  Administered 2015-05-16: 5 mg via ORAL

## 2015-05-16 MED ORDER — OXYMETAZOLINE HCL 0.05 % NA SOLN
NASAL | Status: DC | PRN
  Administered 2015-05-16: 1 via TOPICAL

## 2015-05-16 MED ORDER — ONDANSETRON HCL 4 MG/2ML IJ SOLN
INTRAMUSCULAR | Status: DC | PRN
  Administered 2015-05-16: 4 mg via INTRAVENOUS

## 2015-05-16 MED ORDER — LACTATED RINGERS IV SOLN
INTRAVENOUS | Status: DC
  Administered 2015-05-16: 08:00:00 via INTRAVENOUS

## 2015-05-16 MED ORDER — BACITRACIN 500 UNIT/GM EX OINT
TOPICAL_OINTMENT | CUTANEOUS | Status: DC | PRN
  Administered 2015-05-16: 1 via TOPICAL

## 2015-05-16 MED ORDER — LIDOCAINE HCL (CARDIAC) 20 MG/ML IV SOLN
INTRAVENOUS | Status: DC | PRN
  Administered 2015-05-16: 40 mg via INTRAVENOUS

## 2015-05-16 MED ORDER — DOXYCYCLINE HYCLATE 100 MG PO TABS: 100.0000 mg | ORAL_TABLET | Freq: Two times a day (BID) | ORAL | Status: DC

## 2015-05-16 MED ORDER — DEXAMETHASONE SODIUM PHOSPHATE 4 MG/ML IJ SOLN
INTRAMUSCULAR | Status: DC | PRN
  Administered 2015-05-16: 8 mg via INTRAVENOUS

## 2015-05-16 MED ORDER — PROPOFOL 10 MG/ML IV BOLUS
INTRAVENOUS | Status: DC | PRN
  Administered 2015-05-16: 200 mg via INTRAVENOUS

## 2015-05-16 MED ORDER — MIDAZOLAM HCL 5 MG/5ML IJ SOLN
INTRAMUSCULAR | Status: DC | PRN
  Administered 2015-05-16: 2 mg via INTRAVENOUS

## 2015-05-16 MED ORDER — PROMETHAZINE HCL 25 MG/ML IJ SOLN: 6.2500 mg | INTRAMUSCULAR | Status: DC | PRN

## 2015-05-16 MED ORDER — MEPERIDINE HCL 25 MG/ML IJ SOLN: 6.2500 mg | INTRAMUSCULAR | Status: DC | PRN

## 2015-05-16 MED ORDER — HYDROMORPHONE HCL 1 MG/ML IJ SOLN
0.2500 mg | INTRAMUSCULAR | Status: DC | PRN
  Administered 2015-05-16 (×2): 0.4 mg via INTRAVENOUS

## 2015-05-16 MED ORDER — SUCCINYLCHOLINE CHLORIDE 20 MG/ML IJ SOLN
INTRAMUSCULAR | Status: DC | PRN
  Administered 2015-05-16: 100 mg via INTRAVENOUS

## 2015-05-16 MED ORDER — PROMETHAZINE HCL 12.5 MG PO TABS: 12.5000 mg | ORAL_TABLET | Freq: Four times a day (QID) | ORAL | Status: DC | PRN

## 2015-05-16 MED ORDER — OXYCODONE HCL 5 MG PO TABS: 5.0000 mg | ORAL_TABLET | Freq: Once | ORAL | Status: AC | PRN

## 2015-05-16 MED ORDER — ACETAMINOPHEN 10 MG/ML IV SOLN
1000.0000 mg | Freq: Once | INTRAVENOUS | Status: AC
  Administered 2015-05-16: 1000 mg via INTRAVENOUS

## 2015-05-16 MED ORDER — LIDOCAINE-EPINEPHRINE 1 %-1:100000 IJ SOLN
INTRAMUSCULAR | Status: DC | PRN
  Administered 2015-05-16: 8.5 mL

## 2015-05-16 MED ORDER — OXYCODONE-ACETAMINOPHEN 5-325 MG PO TABS: 1.0000 | ORAL_TABLET | ORAL | Status: DC | PRN

## 2015-05-16 SURGICAL SUPPLY — 41 items
BALLOON SINUPLASTY SYSTEM (BALLOONS) IMPLANT
BATTERY INSTRU NAVIGATION (MISCELLANEOUS) ×20 IMPLANT
BLADE IRRIGATOR 40D CVD (IRRIGATION / IRRIGATOR) IMPLANT
BTRY SRG DRVR LF (MISCELLANEOUS) ×12
CANISTER SUCT 1200ML W/VALVE (MISCELLANEOUS) ×5 IMPLANT
COAG SUCT 10F 3.5MM HAND CTRL (MISCELLANEOUS) ×5 IMPLANT
DEVICE INFLATION SEID (MISCELLANEOUS) IMPLANT
DRAPE HEAD BAR (DRAPES) ×5 IMPLANT
DRESSING NASL FOAM PST OP SINU (MISCELLANEOUS) IMPLANT
DRSG NASAL 4CM NASOPORE (MISCELLANEOUS) IMPLANT
DRSG NASAL FOAM POST OP SINU (MISCELLANEOUS) ×10
GLOVE BIO SURGEON STRL SZ7.5 (GLOVE) ×12 IMPLANT
IRRIGATOR 4MM STR (IRRIGATION / IRRIGATOR) ×5 IMPLANT
IV NS 500ML (IV SOLUTION) ×5
IV NS 500ML BAXH (IV SOLUTION) ×3 IMPLANT
NAVIGATION MASK REG  ST (MISCELLANEOUS) ×5 IMPLANT
NDL HYPO 25GX1X1/2 BEV (NEEDLE) ×3 IMPLANT
NEEDLE HYPO 25GX1X1/2 BEV (NEEDLE) ×5 IMPLANT
NS IRRIG 500ML POUR BTL (IV SOLUTION) ×5 IMPLANT
PACK DRAPE NASAL/ENT (PACKS) ×5 IMPLANT
PACKING NASAL EPIS 4X2.4 XEROG (MISCELLANEOUS) ×2 IMPLANT
PAD GROUND ADULT SPLIT (MISCELLANEOUS) ×5 IMPLANT
PATTIES SURGICAL .5 X3 (DISPOSABLE) ×5 IMPLANT
SET HANDPIECE IRR DIEGO (MISCELLANEOUS) ×5 IMPLANT
SINUPLASTY SPHENOID GUIDE (MISCELLANEOUS) IMPLANT
SOL ANTI-FOG 6CC FOG-OUT (MISCELLANEOUS) ×3 IMPLANT
SOL FOG-OUT ANTI-FOG 6CC (MISCELLANEOUS) ×2
SPLINT NASAL .50MM LRG (MISCELLANEOUS) ×2 IMPLANT
SPLINT NASAL SEPTAL PRE-CUT (MISCELLANEOUS) ×3 IMPLANT
STRAP BODY AND KNEE 60X3 (MISCELLANEOUS) ×7 IMPLANT
SUT CHROMIC 4 0 RB 1X27 (SUTURE) ×5 IMPLANT
SUT ETHILON 3-0 FS-10 30 BLK (SUTURE)
SUT ETHILON 4-0 (SUTURE) ×5
SUT ETHILON 4-0 FS2 18XMFL BLK (SUTURE) ×3
SUT PLAIN GUT 4-0 (SUTURE) ×3 IMPLANT
SUTURE EHLN 3-0 FS-10 30 BLK (SUTURE) ×3 IMPLANT
SUTURE ETHLN 4-0 FS2 18XMF BLK (SUTURE) IMPLANT
SYR EAR/ULCER 2OZ (SYRINGE) ×2 IMPLANT
SYRINGE 10CC LL (SYRINGE) ×5 IMPLANT
TOWEL OR 17X26 4PK STRL BLUE (TOWEL DISPOSABLE) ×5 IMPLANT
WATER STERILE IRR 500ML POUR (IV SOLUTION) ×2 IMPLANT

## 2015-05-16 NOTE — Transfer of Care (Signed)
Immediate Anesthesia Transfer of Care Note  Patient: Bryan Doyle  Procedure(s) Performed: Procedure(s) with comments: IMAGE GUIDED SINUS SURGERY (N/A) - GAVE DISK TO [facility] Diabetic - oral meds SEPTOPLASTY (N/A) INFERIOR TURBINATE REDUCTION (Bilateral) MAXILLARY ANTROSTOMY (Bilateral) ETHMOIDECTOMY- TOTAL (Right)  Patient Location: PACU  Anesthesia Type: General  Level of Consciousness: awake, alert  and patient cooperative  Airway and Oxygen Therapy: Patient Spontanous Breathing and Patient connected to supplemental oxygen  Post-op Assessment: Post-op Vital signs reviewed, Patient's Cardiovascular Status Stable, Respiratory Function Stable, Patent Airway and No signs of Nausea or vomiting  Post-op Vital Signs: Reviewed and stable  Complications: No apparent anesthesia complications

## 2015-05-16 NOTE — Anesthesia Procedure Notes (Signed)
Procedure Name: Intubation Date/Time: 05/16/2015 8:20 AM Performed by: Londell Moh Pre-anesthesia Checklist: Patient identified, Emergency Drugs available, Suction available, Patient being monitored and Timeout performed Patient Re-evaluated:Patient Re-evaluated prior to inductionOxygen Delivery Method: Circle system utilized Preoxygenation: Pre-oxygenation with 100% oxygen Intubation Type: IV induction Ventilation: Mask ventilation without difficulty Laryngoscope Size: Mac and 3 Grade View: Grade I Tube type: Oral Rae Tube size: 7.5 mm Number of attempts: 1 Placement Confirmation: ETT inserted through vocal cords under direct vision,  positive ETCO2 and breath sounds checked- equal and bilateral Tube secured with: Tape Dental Injury: Teeth and Oropharynx as per pre-operative assessment

## 2015-05-16 NOTE — Anesthesia Postprocedure Evaluation (Signed)
  Anesthesia Post-op Note  Patient: Bryan Doyle  Procedure(s) Performed: Procedure(s) with comments: IMAGE GUIDED SINUS SURGERY (N/A) - GAVE DISK TO [facility] Diabetic - oral meds SEPTOPLASTY (N/A) INFERIOR TURBINATE REDUCTION (Bilateral) MAXILLARY ANTROSTOMY (Bilateral) ETHMOIDECTOMY- TOTAL (Right)  Anesthesia type:General  Patient location: PACU  Post pain: Pain level controlled  Post assessment: Post-op Vital signs reviewed, Patient's Cardiovascular Status Stable, Respiratory Function Stable, Patent Airway and No signs of Nausea or vomiting  Post vital signs: Reviewed and stable  Last Vitals:  Filed Vitals:   05/16/15 1100  BP: 150/91  Pulse: 58  Temp:   Resp: 9    Level of consciousness: awake, alert  and patient cooperative  Complications: No apparent anesthesia complications

## 2015-05-16 NOTE — Op Note (Signed)
..05/16/2015  10:06 AM    Adline Peals  [id number]    Pre-Op Dx:  Deviated Nasal Septum, Hypertrophic Inferior Turbinates  Post-op Dx: Same  Proc:   1)  Nasal Septoplasty,  2) Bilateral Partial Reduction Inferior Turbinates  3)  Bilateral Maxillary Antrostomy  4)  Right Total Ethmoidectomy  5)  Image Guidance Sinus Surgery  Surg:  Dylen Mcelhannon  Anes:  GOT  EBL:  100  Comp:  None  Findings: Right sided septal deviation bone and cartilagenous, Bilateral Inferior Turbinate Hypertrophy successfully reduced, Left sided concha bullosa, Bilateral narrow OMCs, Right mucoid drainage in posterior ethmoid  Procedure: With the patient in a comfortable supine position,  general orotracheal anesthesia was induced without difficulty.  The patient received preoperative Afrin spray for topical decongestion and vasoconstriction.  At an appropriate level, the patient was placed in a semi-sitting position.  Nasal vibrissae were trimmed.   1% Xylocaine with 1:100,000 epinephrine, 5 cc's, was infiltrated into the anterior floor of the nose, into the nasal spine region, into the membranous columella, and finally into the submucoperichondrial plane of the septum on both sides.  Several minutes were allowed for this to take effect.  Cottoniod pledgetts soaked in Afrin were placed into both nasal cavities and left while the patient was prepped and draped in the standard fashion.   A proper time-out was performed.  The Stryker image guidance system was set up and calibrated in the normal fashion with an acceptable error of 0.4 mm.   The materials were removed from the nose and observed to be intact and correct in number.  The nose was inspected with a headlight and zero degree endoscope with the findings as described above.  A left Killian incision was sharply executed and carried down to the caudal edge of the quadrangular cartilage with a 15 blade scapel.  A mucoperichondrial flap was  elelvated along the quadrangular plate back to the bony-cartilaginous junction using caudal elevator and freer elevator. The mucoperiostium was then elevated along the ethmoid plate and the vomer. An itracartilagenous incision was made using the freer elevator and a contralateral mucoperichondiral flap was elevated using a freer elevator.  Care was taken to avoid any large rents or opposing rents in the mucoperichondrial flap.  Boney spurs of the vomer and maxillary crest were removed with Takahashi forceps.  The area of cartilagenous deviation was removed with combination of freer elevator and Takahashi forceps creating a widely patent nasal cavity as well as resolution of obstruction from the cartilagenous deviation. The mucosal flaps were placed back into their anatomic position to allow visualization of the airways. The septum now sat in the midline with an improved airway.  A 4-0 Chromic was used to close the Heidelberg incision as well.   The inferior turbinates were then inspected.  Under endoscopic visualization, the inferior turbinates were infractured bilaterally with a Soil scientist.  A kelly clamp was attached to the anterior-inferior third of each inferior turbinate for approximately one minute.  Under endoscopic visualization, Tru-cutting forceps were used to remove the anterior-inferior third of each inferior turbinate.  Electrocautery was used to control bleeding in the area. The remaining turbinate was then outfractured to open up the airway further. There was no significant bleeding noted. The right turbinate was then trimmed and outfractured in a similar fashion.  The airways were then visualized and showed open passageways on both sides that were significantly improved compared to before surgery.       At this point,  attention was directed to the functional endoscopic sinus surgery aspect of the procedure.  The nose was next inspected with a zero degree endoscope and the middle turbinates  were medialized and afrin soaked pledgets were placed lateral to the turbinates for approximately one minute.  A large left sided concha bullosa was opened with Darci Current and the lateral aspect reduced with Diego microdebrider.  The left uncinate process was infractured with a maxillary ostia seeker. At this time attention was directed to the patient's maxillary sinuses.  On the left, a ball tipped probe was placed through the natural ostia and this was used to create a larger opening.  Using a Diego microdebrider, the maxillary antrostomy was enlarged for a widely patent maxillary antrostomy.  This was repeated in a simlar fashion on the patient's right side.  Hemostasis was performed with topical Afrin soaked pledgets.  Visualization with a zero degree endoscope was used to examine the bilateral maxillary antrostomies which were noted to be widely patent and in continuity with the natural os bilaterally with a 30 degree scope.  Next attention was directed to the patient's right ethmoid sinus.  The right ethmoid bulla was entered with a straight image guidance suction.  The ethmoid cells were opened from a medial and inferior position superior and laterally until the vertical lamella was encountered.  This was opened and the posterior ethmoid was opened in a similar fashion.  All fragments of bone and mucosa were removed with straight biting forceps.  The skull base was identified and trauma was avoided.  Image guidance was used throughout to ensure all diseased air cells were opened.    At this time with all sinuses opened, the patient's nasal cavity was examinated and copiously irrigated with sterile saline.  Meticulous hemostasis was continued and all sinuses were examined and noted to be widely patent.    There was no signifcant bleeding. Nasal splints were applied to both sides of the septum using Xomed 0.93mm regular sized splints that were trimmed, and then held in position with a 3-0 Nylon  through and through suture.  Stamberger sinufoam was placed along the cut edge of the inferior turbinates bilaterally.  The patient was turned back over to anesthesia, and awakened, extubated, and taken to the PACU in satisfactory condition.  Dispo:   PACU to home  Plan: Ice, elevation, narcotic analgesia, steroid taper, and prophylactic antibiotics for the duration of indwelling nasal foreign bodies.  We will reevaluate the patient in the office in 6 days and remove the septal splints.  Return to work in 10 days, strenuous activities in two weeks.   Katianna Mcclenney 05/16/2015 10:06 AM

## 2015-05-16 NOTE — Anesthesia Preprocedure Evaluation (Signed)
Anesthesia Evaluation  Patient identified by MRN, date of birth, ID band Patient awake    Reviewed: Allergy & Precautions, NPO status , Patient's Chart, lab work & pertinent test results, reviewed documented beta blocker date and time   Airway Mallampati: II  TM Distance: >3 FB Neck ROM: Full    Dental no notable dental hx.    Pulmonary neg pulmonary ROS,    Pulmonary exam normal        Cardiovascular hypertension, Normal cardiovascular exam     Neuro/Psych  Headaches, negative psych ROS   GI/Hepatic negative GI ROS, Neg liver ROS,   Endo/Other  diabetes, Type 2, Oral Hypoglycemic Agents  Renal/GU negative Renal ROS  negative genitourinary   Musculoskeletal   Abdominal   Peds  Hematology   Anesthesia Other Findings   Reproductive/Obstetrics                             Anesthesia Physical Anesthesia Plan  ASA: II  Anesthesia Plan: General   Post-op Pain Management:    Induction: Intravenous  Airway Management Planned: Oral ETT  Additional Equipment:   Intra-op Plan:   Post-operative Plan:   Informed Consent: I have reviewed the patients History and Physical, chart, labs and discussed the procedure including the risks, benefits and alternatives for the proposed anesthesia with the patient or authorized representative who has indicated his/her understanding and acceptance.     Plan Discussed with: CRNA  Anesthesia Plan Comments:         Anesthesia Quick Evaluation

## 2015-05-16 NOTE — Progress Notes (Signed)
Pt stating his pain is 10/10 but is setting off apnea alarm and having to be reminded to breathe. Will continue to assess. No intervention required at this time

## 2015-05-17 ENCOUNTER — Encounter: Payer: Self-pay | Admitting: Otolaryngology

## 2015-05-18 LAB — SURGICAL PATHOLOGY

## 2015-05-18 NOTE — H&P (Signed)
..  History and Physical paper copy reviewed and updated date of procedure and will be scanned into system.  

## 2015-06-20 ENCOUNTER — Ambulatory Visit (INDEPENDENT_AMBULATORY_CARE_PROVIDER_SITE_OTHER): Payer: Managed Care, Other (non HMO) | Admitting: Internal Medicine

## 2015-06-20 ENCOUNTER — Encounter: Payer: Self-pay | Admitting: Internal Medicine

## 2015-06-20 VITALS — BP 118/76 | HR 73 | Temp 97.9°F | Wt 216.0 lb

## 2015-06-20 DIAGNOSIS — J069 Acute upper respiratory infection, unspecified: Secondary | ICD-10-CM | POA: Diagnosis not present

## 2015-06-20 MED ORDER — HYDROCODONE-HOMATROPINE 5-1.5 MG/5ML PO SYRP: 5.0000 mL | ORAL_SOLUTION | Freq: Three times a day (TID) | ORAL | Status: DC | PRN

## 2015-06-20 MED ORDER — AZITHROMYCIN 250 MG PO TABS: ORAL_TABLET | ORAL | Status: DC

## 2015-06-20 NOTE — Progress Notes (Signed)
Pre visit review using our clinic review tool, if applicable. No additional management support is needed unless otherwise documented below in the visit note. 

## 2015-06-20 NOTE — Patient Instructions (Signed)
Upper Respiratory Infection, Adult Most upper respiratory infections (URIs) are a viral infection of the air passages leading to the lungs. A URI affects the nose, throat, and upper air passages. The most common type of URI is nasopharyngitis and is typically referred to as "the common cold." URIs run their course and usually go away on their own. Most of the time, a URI does not require medical attention, but sometimes a bacterial infection in the upper airways can follow a viral infection. This is called a secondary infection. Sinus and middle ear infections are common types of secondary upper respiratory infections. Bacterial pneumonia can also complicate a URI. A URI can worsen asthma and chronic obstructive pulmonary disease (COPD). Sometimes, these complications can require emergency medical care and may be life threatening.  CAUSES Almost all URIs are caused by viruses. A virus is a type of germ and can spread from one person to another.  RISKS FACTORS You may be at risk for a URI if:   You smoke.   You have chronic heart or lung disease.  You have a weakened defense (immune) system.   You are very young or very old.   You have nasal allergies or asthma.  You work in crowded or poorly ventilated areas.  You work in health care facilities or schools. SIGNS AND SYMPTOMS  Symptoms typically develop 2-3 days after you come in contact with a cold virus. Most viral URIs last 7-10 days. However, viral URIs from the influenza virus (flu virus) can last 14-18 days and are typically more severe. Symptoms may include:   Runny or stuffy (congested) nose.   Sneezing.   Cough.   Sore throat.   Headache.   Fatigue.   Fever.   Loss of appetite.   Pain in your forehead, behind your eyes, and over your cheekbones (sinus pain).  Muscle aches.  DIAGNOSIS  Your health care provider may diagnose a URI by:  Physical exam.  Tests to check that your symptoms are not due to  another condition such as:  Strep throat.  Sinusitis.  Pneumonia.  Asthma. TREATMENT  A URI goes away on its own with time. It cannot be cured with medicines, but medicines may be prescribed or recommended to relieve symptoms. Medicines may help:  Reduce your fever.  Reduce your cough.  Relieve nasal congestion. HOME CARE INSTRUCTIONS   Take medicines only as directed by your health care provider.   Gargle warm saltwater or take cough drops to comfort your throat as directed by your health care provider.  Use a warm mist humidifier or inhale steam from a shower to increase air moisture. This may make it easier to breathe.  Drink enough fluid to keep your urine clear or pale yellow.   Eat soups and other clear broths and maintain good nutrition.   Rest as needed.   Return to work when your temperature has returned to normal or as your health care provider advises. You may need to stay home longer to avoid infecting others. You can also use a face mask and careful hand washing to prevent spread of the virus.  Increase the usage of your inhaler if you have asthma.   Do not use any tobacco products, including cigarettes, chewing tobacco, or electronic cigarettes. If you need help quitting, ask your health care provider. PREVENTION  The best way to protect yourself from getting a cold is to practice good hygiene.   Avoid oral or hand contact with people with cold   symptoms.   Wash your hands often if contact occurs.  There is no clear evidence that vitamin C, vitamin E, echinacea, or exercise reduces the chance of developing a cold. However, it is always recommended to get plenty of rest, exercise, and practice good nutrition.  SEEK MEDICAL CARE IF:   You are getting worse rather than better.   Your symptoms are not controlled by medicine.   You have chills.  You have worsening shortness of breath.  You have brown or red mucus.  You have yellow or brown nasal  discharge.  You have pain in your face, especially when you bend forward.  You have a fever.  You have swollen neck glands.  You have pain while swallowing.  You have white areas in the back of your throat. SEEK IMMEDIATE MEDICAL CARE IF:   You have severe or persistent:  Headache.  Ear pain.  Sinus pain.  Chest pain.  You have chronic lung disease and any of the following:  Wheezing.  Prolonged cough.  Coughing up blood.  A change in your usual mucus.  You have a stiff neck.  You have changes in your:  Vision.  Hearing.  Thinking.  Mood. MAKE SURE YOU:   Understand these instructions.  Will watch your condition.  Will get help right away if you are not doing well or get worse.   This information is not intended to replace advice given to you by your health care provider. Make sure you discuss any questions you have with your health care provider.   Document Released: 12/17/2000 Document Revised: 11/07/2014 Document Reviewed: 09/28/2013 Elsevier Interactive Patient Education 2016 Elsevier Inc.  

## 2015-06-20 NOTE — Progress Notes (Signed)
HPI  Pt presents to the clinic today with c/o runny nose, cough and chest congestion. This started 1 week ago. He is blowing yellow mucous out of his nose. The cough is nonproductive. He denies fever, chills or body aches. He has not tried anything OTC. He did have a CAP 01/2015. He does have a history of allergies and childhood asthma. He has had sick contacts with similar symptoms. He is UTD on his flu and pneumovax.  Review of Systems      Past Medical History  Diagnosis Date  . Allergy   . Diabetes mellitus   . Gout   . Hyperlipidemia   . Asthma     exercise induced - as child. no current issues  . Headache     sinus    Family History  Problem Relation Age of Onset  . COPD Mother   . Cancer Mother     breast  . Diabetes Father     type 1  . Hyperlipidemia Father   . Hypertension Father   . Cancer Paternal Aunt   . Diabetes Maternal Grandfather   . Prostate cancer Maternal Grandfather   . Colon cancer Neg Hx     Social History   Social History  . Marital Status: Married    Spouse Name: N/A  . Number of Children: 1  . Years of Education: N/A   Occupational History  . 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 Smoking status: Never Smoker   . Smokeless tobacco: Never Used  . Alcohol Use: 0.0 oz/week    0 Standard drinks or equivalent per week     Comment: rarely  . Drug Use: No  . Sexual Activity: Yes   Other Topics Concern  . Not on file   Social History Narrative   Works at [employer], 2006   1 daughter    Allergies  Allergen Reactions  . Penicillins     REACTION: u/k- was told in infancy to avoid  . Sulfa Antibiotics Rash    Severe rash and fever     Constitutional: Denies headache, fatigue, fever or abrupt weight changes.  HEENT:  Positive runny nose, sore throat. Denies eye redness, eye pain, pressure behind the eyes, facial pain, nasal congestion, ear pain, ringing in the ears, wax buildup, or bloody  nose. Respiratory: Positive cough. Denies difficulty breathing or shortness of breath.  Cardiovascular: Denies chest pain, chest tightness, palpitations or swelling in the hands or feet.   No other specific complaints in a complete review of systems (except as listed in HPI above).  Objective:   BP 118/76 mmHg  Pulse 73  Temp(Src) 97.9 F (36.6 C) (Oral)  Wt 216 lb (97.977 kg)  SpO2 98%  Wt Readings from Last 3 Encounters:  06/20/15 216 lb (97.977 kg)  05/16/15 216 lb (97.977 kg)  03/23/15 213 lb 8 oz (96.843 kg)     General: Appears his stated age,  in NAD. HEENT: Head: normal shape and size, no sinus tenderness noted; Eyes: sclera white, no icterus, conjunctiva pink; Ears: Tm's gray and intact, normal light reflex; Nose: mucosa pink and moist, septum midline; Throat/Mouth: + PND. Teeth present, mucosa pink and moist, no exudate noted, no lesions or ulcerations noted.  Neck: No cervical lymphadenopathy.  Cardiovascular: Normal rate and rhythm. S1,S2 noted.  No murmur, rubs or gallops noted.  Pulmonary/Chest: Normal effort and positive vesicular breath sounds. No respiratory distress. No wheezes, rales or ronchi  noted.      Assessment & Plan:   Viral Upper Respiratory Infection:  Get some rest and drink plenty of water Do salt water gargles for the sore throat Rx for Azithromax x 5 days, to start on Saturday if symptoms persist or worsen Rx for Hycodan cough syrup  RTC as needed or if symptoms persist.

## 2015-07-27 ENCOUNTER — Other Ambulatory Visit: Payer: Self-pay

## 2015-07-27 MED ORDER — ONETOUCH ULTRASOFT LANCETS MISC: Status: DC

## 2015-07-27 MED ORDER — GLUCOSE BLOOD VI STRP: ORAL_STRIP | Status: DC

## 2015-07-27 NOTE — Telephone Encounter (Signed)
Pt request refills of test strips and lancets [facility]. Last annual 03/23/15. Done per protocol. Pt voiced understanding.

## 2015-07-31 ENCOUNTER — Other Ambulatory Visit: Payer: Self-pay | Admitting: *Deleted

## 2015-08-02 ENCOUNTER — Telehealth: Payer: Self-pay

## 2015-08-02 NOTE — Telephone Encounter (Signed)
Lattie Haw with [facility] left v/m; got denial from ins co for onetouch test strips; free style test strips are approved. Ref # [id number].  Spoke with pt to advise this info and pt said no he is not getting new meter, pts ins has not changed and pt will contact ins co to see what is going on.  Pt also request print out since 2013 of A1c reports. Copies of 8 labs between 09/10/2011 - 03/20/2015 printed per request and at front desk for pick up. Pt voiced understanding.

## 2015-08-03 MED ORDER — GLUCOSE BLOOD VI STRP: ORAL_STRIP | Status: DC

## 2015-08-03 MED ORDER — FREESTYLE LANCETS MISC: Status: AC

## 2015-08-03 NOTE — Telephone Encounter (Signed)
Pt left v/m requesting cb about ins issue and test strips. Left v/m requesting pt to cb.

## 2015-08-03 NOTE — Addendum Note (Signed)
Addended by: Helene Shoe on: 08/03/2015 12:35 PM   Modules accepted: Orders, Medications

## 2015-08-03 NOTE — Telephone Encounter (Addendum)
Pt said that coverage plan did change and onetouch no longer covered. Will cover free style lite lancets and test strips. Also got v/m from [place] with [facility] [phone] ref# [id number] that pts ins plan does cover free style lite lancets and strips and pt can get free free style meter from [employer]. I called [facility] and if gave verbal order over phone pt would get free free style meter and lancet device. Gave order for freestyle lancets and test strips. Pt notified done.

## 2015-09-02 ENCOUNTER — Other Ambulatory Visit: Payer: Self-pay | Admitting: Family Medicine

## 2015-09-02 DIAGNOSIS — E119 Type 2 diabetes mellitus without complications: Secondary | ICD-10-CM

## 2015-09-02 DIAGNOSIS — Z119 Encounter for screening for infectious and parasitic diseases, unspecified: Secondary | ICD-10-CM

## 2015-09-10 ENCOUNTER — Other Ambulatory Visit: Payer: Self-pay | Admitting: Family Medicine

## 2015-09-10 ENCOUNTER — Other Ambulatory Visit (INDEPENDENT_AMBULATORY_CARE_PROVIDER_SITE_OTHER): Payer: Managed Care, Other (non HMO)

## 2015-09-10 DIAGNOSIS — Z119 Encounter for screening for infectious and parasitic diseases, unspecified: Secondary | ICD-10-CM | POA: Diagnosis not present

## 2015-09-10 DIAGNOSIS — E119 Type 2 diabetes mellitus without complications: Secondary | ICD-10-CM

## 2015-09-10 LAB — LIPID PANEL
CHOL/HDL RATIO: 4
Cholesterol: 114 mg/dL (ref 0–200)
HDL: 29.2 mg/dL — ABNORMAL LOW (ref >39.00)
LDL CALC: 66 mg/dL (ref 0–99)
NONHDL: 84.58
Triglycerides: 91 mg/dL (ref 0.0–149.0)
VLDL: 18.2 mg/dL (ref 0.0–40.0)

## 2015-09-10 LAB — GLUCOSE, RANDOM: GLUCOSE: 130 mg/dL — AB (ref 70–99)

## 2015-09-10 LAB — HEMOGLOBIN A1C: Hgb A1c MFr Bld: 6.6 % — ABNORMAL HIGH (ref 4.6–6.5)

## 2015-09-10 NOTE — Addendum Note (Signed)
Addended by: Ellamae Sia on: 09/10/2015 09:18 AM   Modules accepted: Orders

## 2015-09-11 LAB — HIV ANTIBODY (ROUTINE TESTING W REFLEX): HIV: NONREACTIVE

## 2015-09-12 ENCOUNTER — Ambulatory Visit (INDEPENDENT_AMBULATORY_CARE_PROVIDER_SITE_OTHER): Payer: Managed Care, Other (non HMO) | Admitting: Family Medicine

## 2015-09-12 ENCOUNTER — Encounter: Payer: Self-pay | Admitting: Family Medicine

## 2015-09-12 VITALS — BP 114/78 | HR 68 | Temp 98.4°F | Wt 222.5 lb

## 2015-09-12 DIAGNOSIS — E119 Type 2 diabetes mellitus without complications: Secondary | ICD-10-CM

## 2015-09-12 MED ORDER — METFORMIN HCL 500 MG PO TABS: 500.0000 mg | ORAL_TABLET | Freq: Two times a day (BID) | ORAL | Status: DC

## 2015-09-12 NOTE — Assessment & Plan Note (Signed)
A1c d/w pt.   Up some.  We had tapered his metformin some prev.   Work has been busy.  D/w pt.   Weight up in the meantime.   D/w pt about diet and exercise.  He wanted to go back to higher dose of metformin.   D/w pt.  Reasonable.  Recheck A1c later this fall.  He agrees.

## 2015-09-12 NOTE — Patient Instructions (Signed)
Recheck in about 6 months at a physical.  Labs ahead of time. Higher dose of metformin in the meantime.  Keep trying to work on your weight.  Take care.  Glad to see you.

## 2015-09-12 NOTE — Progress Notes (Signed)
Pre visit review using our clinic review tool, if applicable. No additional management support is needed unless otherwise documented below in the visit note.  Diabetes:  Using medications without difficulties:yes Hypoglycemic episodes:no Hyperglycemic episodes:no Feet problems: prev with plantar fasciitis.  He has tried different shoes, with some relief with the current pair of shoes.   Blood Sugars averaging: usually ~120 in the AM.   A1c d/w pt.   Up some.  We had tapered his metformin some prev.   Work has been busy.  D/w pt.   Weight up in the meantime.   He wanted to go back to higher dose of metformin.   D/w pt.  Form for work done and given back to patient.   Meds, vitals, and allergies reviewed.   ROS: See HPI.  Otherwise negative.    GEN: nad, alert and oriented HEENT: mucous membranes moist NECK: supple w/o LA CV: rrr. PULM: ctab, no inc wob ABD: soft, +bs EXT: no edema

## 2015-09-14 ENCOUNTER — Ambulatory Visit: Payer: Managed Care, Other (non HMO) | Admitting: Family Medicine

## 2015-10-04 ENCOUNTER — Encounter: Payer: Self-pay | Admitting: Internal Medicine

## 2015-10-04 ENCOUNTER — Ambulatory Visit (INDEPENDENT_AMBULATORY_CARE_PROVIDER_SITE_OTHER): Payer: Managed Care, Other (non HMO) | Admitting: Internal Medicine

## 2015-10-04 VITALS — BP 120/76 | HR 63 | Temp 98.2°F | Wt 223.0 lb

## 2015-10-04 DIAGNOSIS — J0100 Acute maxillary sinusitis, unspecified: Secondary | ICD-10-CM | POA: Diagnosis not present

## 2015-10-04 MED ORDER — AZITHROMYCIN 250 MG PO TABS: ORAL_TABLET | ORAL | Status: DC

## 2015-10-04 NOTE — Assessment & Plan Note (Signed)
Could be allergic still Discussed using nasal steroid daily and adding back antihistamine Start z-pak if worsening

## 2015-10-04 NOTE — Patient Instructions (Signed)
Please use the nasal spray daily and an antihistamine. If you worsen, or are not improving by next week, start the antibiotic

## 2015-10-04 NOTE — Progress Notes (Signed)
Subjective:    Patient ID: Bryan Doyle, male    DOB: 03-01-1971, 45 y.o.   MRN: [id number]  HPI Here due to head congestion Thought it was just allergies Green nasal drainage  Goes back about 5 days Low grade fever this morning for him No chills or sweats Very little cough No SOB Some sinus pressure and maxillary pain No ear pain No sore throat  Tried advil cold and sinus---does help the pressure  Current Outpatient Prescriptions on File Prior to Visit  Medication Sig Dispense Refill  . Ascorbic Acid (VITAMIN C) 500 MG tablet Take 500 mg by mouth daily.      . Beclomethasone Diprop, Nasal, (QNASL NA) Place into the nose daily.    Marland Kitchen glucose blood (FREESTYLE LITE) test strip Check blood sugar once daily and as directed. Dx E11.9 100 each 3  . Lancets (FREESTYLE) lancets Check blood sugar once daily and as directed. Dx E11.9 100 each 3  . lisinopril (PRINIVIL,ZESTRIL) 2.5 MG tablet Take 1 tablet (2.5 mg total) by mouth daily. 90 tablet 3  . metFORMIN (GLUCOPHAGE) 500 MG tablet Take 1 tablet (500 mg total) by mouth 2 (two) times daily with a meal. 180 tablet 3  . pravastatin (PRAVACHOL) 10 MG tablet Take 1 tablet (10 mg total) by mouth daily. 90 tablet 3   No current facility-administered medications on file prior to visit.    Allergies  Allergen Reactions  . Penicillins     REACTION: u/k- was told in infancy to avoid  . Sulfa Antibiotics Rash    Severe rash and fever    Past Medical History  Diagnosis Date  . Allergy   . Diabetes mellitus   . Gout   . Hyperlipidemia   . Asthma     exercise induced - as child. no current issues  . Headache     sinus    Past Surgical History  Procedure Laterality Date  . Hernia repair      bilateral   . Vasectomy  2013    Dr. Jacqlyn Larsen  . Image guided sinus surgery N/A 05/16/2015    Procedure: IMAGE GUIDED SINUS SURGERY;  Surgeon: Carloyn Manner, MD;  Location: [place];  Service: ENT;  Laterality: N/A;  GAVE  DISK TO [facility] Diabetic - oral meds  . Septoplasty N/A 05/16/2015    Procedure: SEPTOPLASTY;  Surgeon: Carloyn Manner, MD;  Location: [place];  Service: ENT;  Laterality: N/A;  . Turbinate reduction Bilateral 05/16/2015    Procedure: INFERIOR TURBINATE REDUCTION;  Surgeon: Carloyn Manner, MD;  Location: [place];  Service: ENT;  Laterality: Bilateral;  . Maxillary antrostomy Bilateral 05/16/2015    Procedure: MAXILLARY ANTROSTOMY;  Surgeon: Carloyn Manner, MD;  Location: [place];  Service: ENT;  Laterality: Bilateral;  . Ethmoidectomy Right 05/16/2015    Procedure: ETHMOIDECTOMY- TOTAL;  Surgeon: Carloyn Manner, MD;  Location: [place];  Service: ENT;  Laterality: Right;    Family History  Problem Relation Age of Onset  . COPD Mother   . Cancer Mother     breast  . Diabetes Father     type 1  . Hyperlipidemia Father   . Hypertension Father   . Cancer Paternal Aunt   . Diabetes Maternal Grandfather   . Prostate cancer Maternal Grandfather   . Colon cancer Neg Hx     Social History   Social History  . Marital Status: Married    Spouse Name: N/A  . Number  of Children: 1  . Years of Education: N/A   Occupational History  . 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 Smoking status: Never Smoker   . Smokeless tobacco: Never Used  . Alcohol Use: 0.0 oz/week    0 Standard drinks or equivalent per week     Comment: rarely  . Drug Use: No  . Sexual Activity: Yes   Other Topics Concern  . Not on file   Social History Narrative   Works at [employer], 2006   1 daughter   Review of Systems No rash No vomiting or diarrhea Appetite is okay    Objective:   Physical Exam  Constitutional: He appears well-developed and well-nourished. No distress.  HENT:  Mouth/Throat: Oropharynx is clear and moist. No oropharyngeal exudate.  No sinus tenderness Moderate nasal inflammation TMs normal   Neck:  Normal range of motion. Neck supple. No thyromegaly present.  Pulmonary/Chest: Effort normal and breath sounds normal. No respiratory distress. He has no wheezes. He has no rales.  Lymphadenopathy:    He has no cervical adenopathy.          Assessment & Plan:

## 2015-10-04 NOTE — Progress Notes (Signed)
Pre visit review using our clinic review tool, if applicable. No additional management support is needed unless otherwise documented below in the visit note. 

## 2015-10-08 ENCOUNTER — Telehealth: Payer: Self-pay

## 2015-10-08 MED ORDER — PERMETHRIN 5 % EX CREA: 1.0000 "application " | TOPICAL_CREAM | Freq: Once | CUTANEOUS | Status: DC

## 2015-10-08 NOTE — Telephone Encounter (Signed)
Pt left v/m; pts wife and daughter are being treated for head lice; pt was checked and told did not have lice but is concerned may get lice and wants to know if preventative treatment for lice. Pt request cb. [facility].

## 2015-10-08 NOTE — Telephone Encounter (Signed)
Patient notified as instructed by telephone and verbalized understanding. 

## 2015-10-08 NOTE — Telephone Encounter (Signed)
Sent.  Make sure to wash all the bed linens/recently worn clothes and then dry them in a dryer.  Thanks.

## 2016-03-21 ENCOUNTER — Other Ambulatory Visit (INDEPENDENT_AMBULATORY_CARE_PROVIDER_SITE_OTHER): Payer: Managed Care, Other (non HMO)

## 2016-03-21 ENCOUNTER — Other Ambulatory Visit: Payer: Self-pay | Admitting: Family Medicine

## 2016-03-21 DIAGNOSIS — Z8739 Personal history of other diseases of the musculoskeletal system and connective tissue: Secondary | ICD-10-CM

## 2016-03-21 DIAGNOSIS — E119 Type 2 diabetes mellitus without complications: Secondary | ICD-10-CM | POA: Diagnosis not present

## 2016-03-21 DIAGNOSIS — Z8639 Personal history of other endocrine, nutritional and metabolic disease: Secondary | ICD-10-CM

## 2016-03-21 LAB — COMPREHENSIVE METABOLIC PANEL
ALT: 68 U/L — ABNORMAL HIGH (ref 0–53)
AST: 38 U/L — ABNORMAL HIGH (ref 0–37)
Albumin: 3.8 g/dL (ref 3.5–5.2)
Alkaline Phosphatase: 93 U/L (ref 39–117)
BUN: 12 mg/dL (ref 6–23)
CHLORIDE: 105 meq/L (ref 96–112)
CO2: 30 mEq/L (ref 19–32)
Calcium: 8.6 mg/dL (ref 8.4–10.5)
Creatinine, Ser: 1.02 mg/dL (ref 0.40–1.50)
GFR: 84.11 mL/min (ref >60.00)
GLUCOSE: 128 mg/dL — AB (ref 70–99)
POTASSIUM: 4.1 meq/L (ref 3.5–5.1)
SODIUM: 140 meq/L (ref 135–145)
Total Bilirubin: 0.4 mg/dL (ref 0.2–1.2)
Total Protein: 6.4 g/dL (ref 6.0–8.3)

## 2016-03-21 LAB — URIC ACID: URIC ACID, SERUM: 6 mg/dL (ref 4.0–7.8)

## 2016-03-21 LAB — HEMOGLOBIN A1C: Hgb A1c MFr Bld: 6.9 % — ABNORMAL HIGH (ref 4.6–6.5)

## 2016-03-25 ENCOUNTER — Ambulatory Visit (INDEPENDENT_AMBULATORY_CARE_PROVIDER_SITE_OTHER): Payer: Managed Care, Other (non HMO) | Admitting: Family Medicine

## 2016-03-25 ENCOUNTER — Encounter: Payer: Self-pay | Admitting: Family Medicine

## 2016-03-25 VITALS — BP 120/84 | HR 70 | Temp 98.4°F | Ht 67.5 in | Wt 226.0 lb

## 2016-03-25 DIAGNOSIS — R740 Nonspecific elevation of levels of transaminase and lactic acid dehydrogenase [LDH]: Secondary | ICD-10-CM

## 2016-03-25 DIAGNOSIS — Z8739 Personal history of other diseases of the musculoskeletal system and connective tissue: Secondary | ICD-10-CM

## 2016-03-25 DIAGNOSIS — Z0000 Encounter for general adult medical examination without abnormal findings: Secondary | ICD-10-CM

## 2016-03-25 DIAGNOSIS — R7401 Elevation of levels of liver transaminase levels: Secondary | ICD-10-CM

## 2016-03-25 DIAGNOSIS — E781 Pure hyperglyceridemia: Secondary | ICD-10-CM

## 2016-03-25 DIAGNOSIS — E119 Type 2 diabetes mellitus without complications: Secondary | ICD-10-CM

## 2016-03-25 NOTE — Progress Notes (Signed)
Pre visit review using our clinic review tool, if applicable. No additional management support is needed unless otherwise documented below in the visit note. 

## 2016-03-25 NOTE — Progress Notes (Signed)
CPE- See plan.  Routine anticipatory guidance given to patient.  See health maintenance. Tetanus 2010 Flu to be done at work.   PNA and shingles not due.  HIV screening prev done.  Colon and prostate CA screening not due.   Living will d/w pt.  Wife designated if patient were incapacitated.   Diet and exercise d/w pt.  He is displeased with his current diet and level of exercise.  Encouraged both.  He would like to work on exercise more.  D/w pt.    L 1st MTP pain typical for gout, now some better on indocin.  No ADE on med.  Uric acid at goal.   Rare flares overall.     Diabetes:  Using medications without difficulties:yes Hypoglycemic episodes: no Hyperglycemic episodes: no Feet problems: not other than gout Blood Sugars averaging: usually ~90-120 eye exam within last year: due, d/w pt.  He'll check on that.    Elevated Cholesterol: Using medications without problems:yes Muscle aches: no Diet compliance: see above Exercise: see above.   PMH and SH reviewed  Meds, vitals, and allergies reviewed.   ROS: Per HPI.  Unless specifically indicated otherwise in HPI, the patient denies:  General: fever. Eyes: acute vision changes ENT: sore throat Cardiovascular: chest pain Respiratory: SOB GI: vomiting GU: dysuria Musculoskeletal: acute back pain Derm: acute rash Neuro: acute motor dysfunction Psych: worsening mood Endocrine: polydipsia Heme: bleeding Allergy: hayfever  GEN: nad, alert and oriented HEENT: mucous membranes moist NECK: supple w/o LA CV: rrr. PULM: ctab, no inc wob ABD: soft, +bs EXT: no edema SKIN: no acute rash  Diabetic foot exam: Normal inspection except for L 1st MTP puffy and tender.   No skin breakdown No calluses  Normal DP pulses Normal sensation to light touch and monofilament Nails normal

## 2016-03-25 NOTE — Patient Instructions (Signed)
Call about an eye exam.   Flu shot this fall at work- let me know and I'll put it in the chart.

## 2016-03-26 MED ORDER — METFORMIN HCL 500 MG PO TABS: 500.0000 mg | ORAL_TABLET | Freq: Two times a day (BID) | ORAL | 3 refills | Status: DC

## 2016-03-26 MED ORDER — PRAVASTATIN SODIUM 10 MG PO TABS: 10.0000 mg | ORAL_TABLET | Freq: Every day | ORAL | 3 refills | Status: DC

## 2016-03-26 MED ORDER — LISINOPRIL 2.5 MG PO TABS: 2.5000 mg | ORAL_TABLET | Freq: Every day | ORAL | 3 refills | Status: DC

## 2016-03-26 NOTE — Assessment & Plan Note (Signed)
Continue Indocin for now. Discussed with patient about diet and exercise. Labs discussed with patient. He is somewhat improved.

## 2016-03-26 NOTE — Assessment & Plan Note (Signed)
Labs discussed with patient. Needs more work on diet and exercise. No med changes. We will recheck labs periodically. He agrees.

## 2016-03-26 NOTE — Assessment & Plan Note (Signed)
Tetanus 2010 Flu to be done at work.   PNA and shingles not due.  HIV screening prev done.  Colon and prostate CA screening not due.   Living will d/w pt.  Wife designated if patient were incapacitated.   Diet and exercise d/w pt.  He is displeased with his current diet and level of exercise.  Encouraged both.  He would like to work on exercise more.  D/w pt.

## 2016-03-26 NOTE — Assessment & Plan Note (Signed)
He likely has some fatty deposits in his liver. Labs discussed with patient. Treatment is still diet and exercise. He agrees.

## 2016-05-05 ENCOUNTER — Encounter: Payer: Self-pay | Admitting: Family Medicine

## 2016-05-05 ENCOUNTER — Ambulatory Visit (INDEPENDENT_AMBULATORY_CARE_PROVIDER_SITE_OTHER): Payer: Managed Care, Other (non HMO) | Admitting: Family Medicine

## 2016-05-05 DIAGNOSIS — H6690 Otitis media, unspecified, unspecified ear: Secondary | ICD-10-CM | POA: Diagnosis not present

## 2016-05-05 MED ORDER — AZITHROMYCIN 250 MG PO TABS: ORAL_TABLET | ORAL | 0 refills | Status: DC

## 2016-05-05 NOTE — Progress Notes (Signed)
duration of symptoms: gradually worse in the last 1-2 weeks, worse with plane flight.   Rhinorrhea: yes congestion:yes ear pain:yes sore throat: no Cough: minimal  Myalgias: not today but prev with some aches.   Fever recently.  Fatigued.  Taking mucinex.   Mult sick contacts.    Per HPI unless specifically indicated in ROS section   Meds, vitals, and allergies reviewed.   GEN: nad, alert and oriented HEENT: mucous membranes moist, R TM w/o erythema, L TM pink, nasal epithelium injected, OP with cobblestoning, sinuses not ttp NECK: supple w/o LA CV: rrr. PULM: ctab, no inc wob ABD: soft, +bs EXT: no edema

## 2016-05-05 NOTE — Patient Instructions (Signed)
Likely left ear infection.  Zithromax. Rest and fluids.  Take care.  Glad to see you.

## 2016-05-05 NOTE — Progress Notes (Signed)
Pre visit review using our clinic review tool, if applicable. No additional management support is needed unless otherwise documented below in the visit note. 

## 2016-05-06 NOTE — Assessment & Plan Note (Signed)
Likely acute otitis media. Discussed with patient. Nontoxic. Start azithromycin. Supportive care otherwise. Follow-up as needed. He agrees.

## 2016-09-12 ENCOUNTER — Telehealth: Payer: Managed Care, Other (non HMO) | Admitting: Family

## 2016-09-12 DIAGNOSIS — R059 Cough, unspecified: Secondary | ICD-10-CM

## 2016-09-12 DIAGNOSIS — R05 Cough: Secondary | ICD-10-CM

## 2016-09-12 MED ORDER — PREDNISONE 5 MG PO TABS: 5.0000 mg | ORAL_TABLET | ORAL | 0 refills | Status: DC

## 2016-09-12 MED ORDER — BENZONATATE 100 MG PO CAPS: 100.0000 mg | ORAL_CAPSULE | Freq: Three times a day (TID) | ORAL | 0 refills | Status: DC | PRN

## 2016-09-12 MED ORDER — ALBUTEROL SULFATE HFA 108 (90 BASE) MCG/ACT IN AERS: 2.0000 | INHALATION_SPRAY | Freq: Four times a day (QID) | RESPIRATORY_TRACT | 2 refills | Status: DC | PRN

## 2016-09-12 NOTE — Progress Notes (Signed)
We are sorry that you are not feeling well.  Here is how we plan to help!  Based on what you have shared with me it looks like you have upper respiratory tract inflammation that has resulted in a significant cough.  Inflammation and infection in the upper respiratory tract is commonly called bronchitis and has four common causes:  Allergies, Viral Infections, Acid Reflux and Bacterial Infections.  Allergies, viruses and acid reflux are treated by controlling symptoms or eliminating the cause. An example might be a cough caused by taking certain blood pressure medications. You stop the cough by changing the medication. Another example might be a cough caused by acid reflux. Controlling the reflux helps control the cough.  Based on your presentation I believe you most likely have A cough due to allergies.  I recommend that you start the an over-the counter-allergy medication such as Claritin 10 mg or Zyrtec 10 mg daily.  This may also be due to airway irritation from other unknown factors; you are at higher risk due to your history of asthma.   In addition you may use A non-prescription cough medication called Mucinex DM: take 2 tablets every 12 hours. and A prescription cough medication called Tessalon Perles 100mg . You may take 1-2 capsules every 8 hours as needed for your cough.  Sterapred 5 mg dosepak   Albuterol inhaler, take 2 puffs every 6 hours as needed for shortness of breath if necessary.   USE OF BRONCHODILATOR ("RESCUE") INHALERS: There is a risk from using your bronchodilator too frequently.  The risk is that over-reliance on a medication which only relaxes the muscles surrounding the breathing tubes can reduce the effectiveness of medications prescribed to reduce swelling and congestion of the tubes themselves.  Although you feel brief relief from the bronchodilator inhaler, your asthma may actually be worsening with the tubes becoming more swollen and filled with mucus.  This can delay  other crucial treatments, such as oral steroid medications. If you need to use a bronchodilator inhaler daily, several times per day, you should discuss this with your provider.  There are probably better treatments that could be used to keep your asthma under control.     HOME CARE . Only take medications as instructed by your medical team. . Complete the entire course of an antibiotic. . Drink plenty of fluids and get plenty of rest. . Avoid close contacts especially the very young and the elderly . Cover your mouth if you cough or cough into your sleeve. . Always remember to wash your hands . A steam or ultrasonic humidifier can help congestion.   GET HELP RIGHT AWAY IF: . You develop worsening fever. . You become short of breath . You cough up blood. . Your symptoms persist after you have completed your treatment plan MAKE SURE YOU   Understand these instructions.  Will watch your condition.  Will get help right away if you are not doing well or get worse.  Your e-visit answers were reviewed by a board certified advanced clinical practitioner to complete your personal care plan.  Depending on the condition, your plan could have included both over the counter or prescription medications. If there is a problem please reply  once you have received a response from your provider. Your safety is important to [place].  If you have drug allergies check your prescription carefully.    You can use MyChart to ask questions about today's visit, request a non-urgent call back, or ask for a  work or school excuse for 24 hours related to this e-Visit. If it has been greater than 24 hours you will need to follow up with your provider, or enter a new e-Visit to address those concerns. You will get an e-mail in the next two days asking about your experience.  I hope that your e-visit has been valuable and will speed your recovery. Thank you for using e-visits.   

## 2016-09-21 ENCOUNTER — Other Ambulatory Visit: Payer: Self-pay | Admitting: Family Medicine

## 2016-09-21 DIAGNOSIS — E119 Type 2 diabetes mellitus without complications: Secondary | ICD-10-CM

## 2016-09-24 ENCOUNTER — Other Ambulatory Visit (INDEPENDENT_AMBULATORY_CARE_PROVIDER_SITE_OTHER): Payer: 59

## 2016-09-24 DIAGNOSIS — E119 Type 2 diabetes mellitus without complications: Secondary | ICD-10-CM

## 2016-09-24 LAB — COMPREHENSIVE METABOLIC PANEL
ALT: 68 U/L — ABNORMAL HIGH (ref 0–53)
AST: 48 U/L — AB (ref 0–37)
Albumin: 3.9 g/dL (ref 3.5–5.2)
Alkaline Phosphatase: 84 U/L (ref 39–117)
BUN: 14 mg/dL (ref 6–23)
CHLORIDE: 102 meq/L (ref 96–112)
CO2: 30 mEq/L (ref 19–32)
Calcium: 9.3 mg/dL (ref 8.4–10.5)
Creatinine, Ser: 0.97 mg/dL (ref 0.40–1.50)
GFR: 88.93 mL/min (ref >60.00)
Glucose, Bld: 158 mg/dL — ABNORMAL HIGH (ref 70–99)
POTASSIUM: 3.8 meq/L (ref 3.5–5.1)
SODIUM: 138 meq/L (ref 135–145)
Total Bilirubin: 0.7 mg/dL (ref 0.2–1.2)
Total Protein: 6.4 g/dL (ref 6.0–8.3)

## 2016-09-24 LAB — LIPID PANEL
CHOLESTEROL: 128 mg/dL (ref 0–200)
HDL: 29.3 mg/dL — AB (ref >39.00)
LDL CALC: 68 mg/dL (ref 0–99)
NonHDL: 98.65
TRIGLYCERIDES: 153 mg/dL — AB (ref 0.0–149.0)
Total CHOL/HDL Ratio: 4
VLDL: 30.6 mg/dL (ref 0.0–40.0)

## 2016-09-24 LAB — HEMOGLOBIN A1C: HEMOGLOBIN A1C: 7.3 % — AB (ref 4.6–6.5)

## 2016-09-26 ENCOUNTER — Ambulatory Visit (INDEPENDENT_AMBULATORY_CARE_PROVIDER_SITE_OTHER): Payer: 59 | Admitting: Family Medicine

## 2016-09-26 ENCOUNTER — Encounter: Payer: Self-pay | Admitting: Family Medicine

## 2016-09-26 VITALS — BP 110/78 | HR 62 | Temp 97.8°F | Wt 224.0 lb

## 2016-09-26 DIAGNOSIS — R7989 Other specified abnormal findings of blood chemistry: Secondary | ICD-10-CM | POA: Diagnosis not present

## 2016-09-26 DIAGNOSIS — E119 Type 2 diabetes mellitus without complications: Secondary | ICD-10-CM | POA: Diagnosis not present

## 2016-09-26 DIAGNOSIS — R05 Cough: Secondary | ICD-10-CM | POA: Diagnosis not present

## 2016-09-26 DIAGNOSIS — R7401 Elevation of levels of liver transaminase levels: Secondary | ICD-10-CM

## 2016-09-26 DIAGNOSIS — E781 Pure hyperglyceridemia: Secondary | ICD-10-CM

## 2016-09-26 DIAGNOSIS — R7402 Elevation of levels of lactic acid dehydrogenase (LDH): Secondary | ICD-10-CM

## 2016-09-26 DIAGNOSIS — R945 Abnormal results of liver function studies: Principal | ICD-10-CM

## 2016-09-26 DIAGNOSIS — R059 Cough, unspecified: Secondary | ICD-10-CM

## 2016-09-26 DIAGNOSIS — R740 Nonspecific elevation of levels of transaminase and lactic acid dehydrogenase [LDH]: Secondary | ICD-10-CM | POA: Diagnosis not present

## 2016-09-26 NOTE — Progress Notes (Signed)
Diabetes:  Using medications without difficulties:yes Hypoglycemic episodes: no Hyperglycemic episodes: no Feet problems: no Blood Sugars averaging: ~95-110 usually eye exam within last year: due, d/w pt.  Labs d/w pt.   Elevated Cholesterol: Using medications without problems:yes Muscle aches: no Diet compliance:encouarged Exercise:encouraged D/w pt about diet and exercise.   Mild inc in LFTs.   He had E visit re cough prev.  Didn't have to use inhaler.  Took steroid course and tessalon for a few days but forgot to take them with him on vacation, too 5 days of med. The cough is still present.  Occ present.  Dry cough.  He doesn't feel unwell, but a little fatigued.  D/w pt about post infectious cough vs ACE cough.  The cough isn't improving.  See AVS.    PMH and SH reviewed  Meds, vitals, and allergies reviewed.   ROS: Per HPI unless specifically indicated in ROS section   GEN: nad, alert and oriented HEENT: mucous membranes moist NECK: supple w/o LA CV: rrr. PULM: ctab, no inc wob ABD: soft, +bs EXT: no edema SKIN: no acute rash

## 2016-09-26 NOTE — Patient Instructions (Addendum)
Stop the lisinopril for now and update me in about 2 weeks.   Call about an eye exam when possible.  Take care.  Glad to see you.  Recheck labs in about 6 months before a physical.  Rosaria Ferries will call about your referral.

## 2016-09-28 NOTE — Assessment & Plan Note (Signed)
Could be postinfectious vs ACE cough.  Hold ACE for now.  See AVS.  Okay for outpatient f/u.

## 2016-09-28 NOTE — Assessment & Plan Note (Signed)
Needs diet and exercise.  Continue metformin.  See AVS. Okay for outpatient f/u.  Labs d/w pt.

## 2016-09-28 NOTE — Assessment & Plan Note (Signed)
Needs diet and exercise.  Continue statin for now.  D/w pt.

## 2016-09-28 NOTE — Assessment & Plan Note (Signed)
Labs d/w pt.  Likely from fatty liver.  Needs U/s, ordered.  D/w pt.

## 2016-10-03 ENCOUNTER — Ambulatory Visit
Admission: RE | Admit: 2016-10-03 | Discharge: 2016-10-03 | Disposition: A | Discharge status: Home / Self Care | Payer: 59 | Source: Ambulatory Visit | Attending: Family Medicine | Admitting: Family Medicine

## 2016-10-03 DIAGNOSIS — R7989 Other specified abnormal findings of blood chemistry: Secondary | ICD-10-CM | POA: Insufficient documentation

## 2016-10-03 DIAGNOSIS — R932 Abnormal findings on diagnostic imaging of liver and biliary tract: Secondary | ICD-10-CM | POA: Diagnosis not present

## 2016-10-03 DIAGNOSIS — R945 Abnormal results of liver function studies: Secondary | ICD-10-CM

## 2016-10-05 ENCOUNTER — Encounter: Payer: Self-pay | Admitting: Family Medicine

## 2016-10-05 DIAGNOSIS — K760 Fatty (change of) liver, not elsewhere classified: Secondary | ICD-10-CM | POA: Insufficient documentation

## 2016-12-12 ENCOUNTER — Other Ambulatory Visit: Payer: Self-pay | Admitting: Family Medicine

## 2017-03-04 LAB — HM DIABETES EYE EXAM

## 2017-03-05 ENCOUNTER — Encounter: Payer: Self-pay | Admitting: Family Medicine

## 2017-03-22 ENCOUNTER — Other Ambulatory Visit: Payer: Self-pay | Admitting: Family Medicine

## 2017-03-22 DIAGNOSIS — E119 Type 2 diabetes mellitus without complications: Secondary | ICD-10-CM

## 2017-03-27 ENCOUNTER — Other Ambulatory Visit (INDEPENDENT_AMBULATORY_CARE_PROVIDER_SITE_OTHER): Payer: 59

## 2017-03-27 DIAGNOSIS — E119 Type 2 diabetes mellitus without complications: Secondary | ICD-10-CM | POA: Diagnosis not present

## 2017-03-27 LAB — HEMOGLOBIN A1C: HEMOGLOBIN A1C: 7.2 % — AB (ref 4.6–6.5)

## 2017-03-30 ENCOUNTER — Ambulatory Visit (INDEPENDENT_AMBULATORY_CARE_PROVIDER_SITE_OTHER): Payer: 59 | Admitting: Family Medicine

## 2017-03-30 ENCOUNTER — Encounter: Payer: Self-pay | Admitting: Family Medicine

## 2017-03-30 DIAGNOSIS — E119 Type 2 diabetes mellitus without complications: Secondary | ICD-10-CM | POA: Diagnosis not present

## 2017-03-30 NOTE — Patient Instructions (Signed)
Keep working on diet and exercise.   Recheck in about 6 months, labs prior to physical.  Update me as needed.  Get a flu shot through work.  Take care.  Glad to see you.

## 2017-03-30 NOTE — Progress Notes (Signed)
Diabetes:  Using medications without difficulties: yes Hypoglycemic episodes:no Hyperglycemic episodes:no Feet problems:no Blood Sugars averaging: 110-125 usually eye exam within last year: yes A1c stable.  D/w pt about diet and exercise.  Encouraged more exercise.  Diet is variable, some weeks are better than others.   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   Flu shot d/w pt, to be done via work.    Meds, vitals, and allergies reviewed.   ROS: Per HPI unless specifically indicated in ROS section   GEN: nad, alert and oriented HEENT: mucous membranes moist NECK: supple w/o LA CV: rrr. PULM: ctab, no inc wob ABD: soft, +bs EXT: no edema  Diabetic foot exam: Normal inspection No skin breakdown No calluses  Normal DP pulses Normal sensation to light touch and monofilament Nails normal

## 2017-03-30 NOTE — Assessment & Plan Note (Signed)
A1c stable. D/w pt.   D/w pt about diet and exercise.  Encouraged more exercise.  Diet is variable, some weeks are better than others.   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   Flu shot d/w pt, to be done via work.   Recheck in about 6 months.  Normal foot exam.

## 2017-04-09 ENCOUNTER — Encounter: Payer: Self-pay | Admitting: Family Medicine

## 2017-05-18 ENCOUNTER — Other Ambulatory Visit: Payer: Self-pay | Admitting: Family Medicine

## 2017-05-19 ENCOUNTER — Other Ambulatory Visit: Payer: Self-pay | Admitting: Family Medicine

## 2017-08-23 IMAGING — US US ABDOMEN LIMITED
1 series · 14 of 25 positions shown · non-contrast
Comparison: None.

CLINICAL DATA: Elevated liver enzymes

EXAM:
US ABDOMEN LIMITED - RIGHT UPPER QUADRANT

[Series 1: us abdomen limited · 0.23mm/px · 14 of 51 slices shown]
[im 1/51]
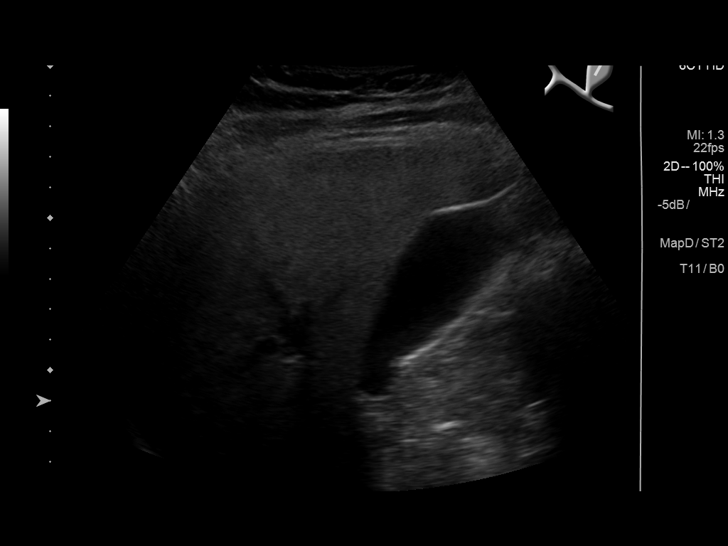
[im 5/51]
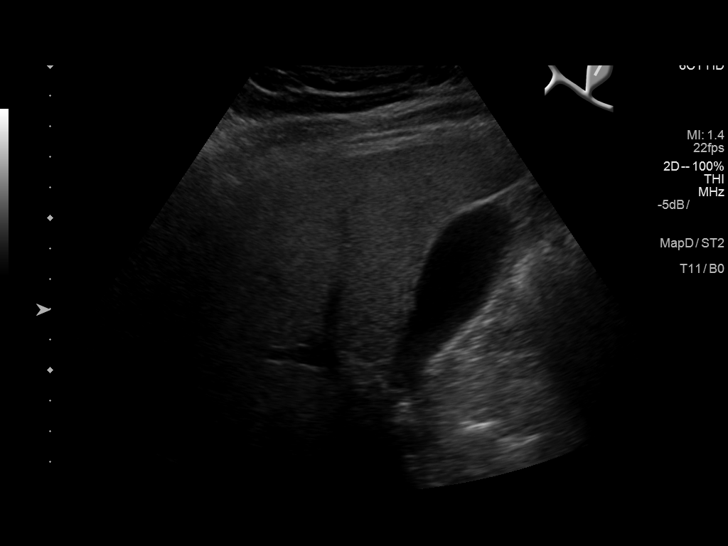
[im 9/51]
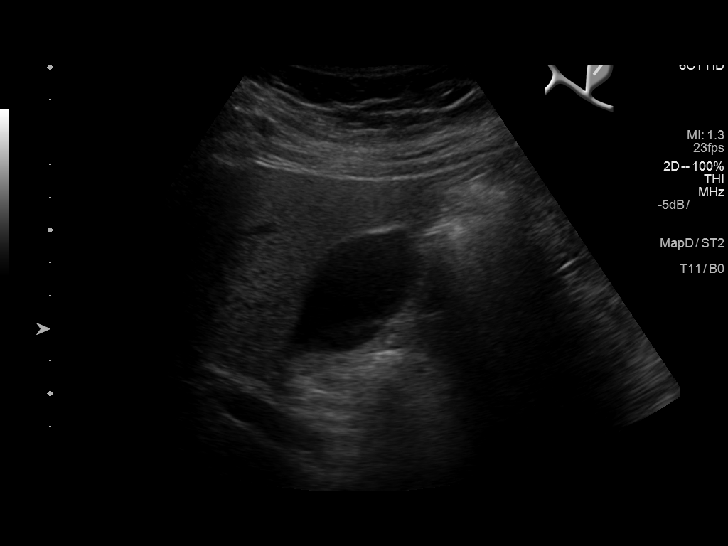
[im 13/51]
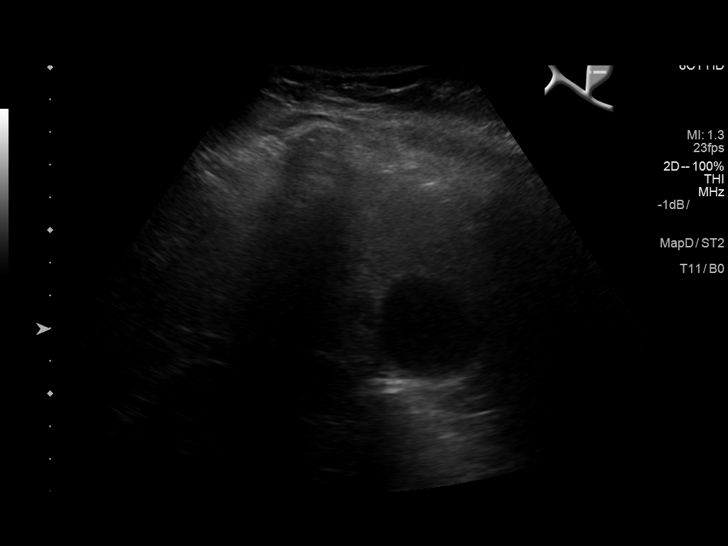
[im 17/51]
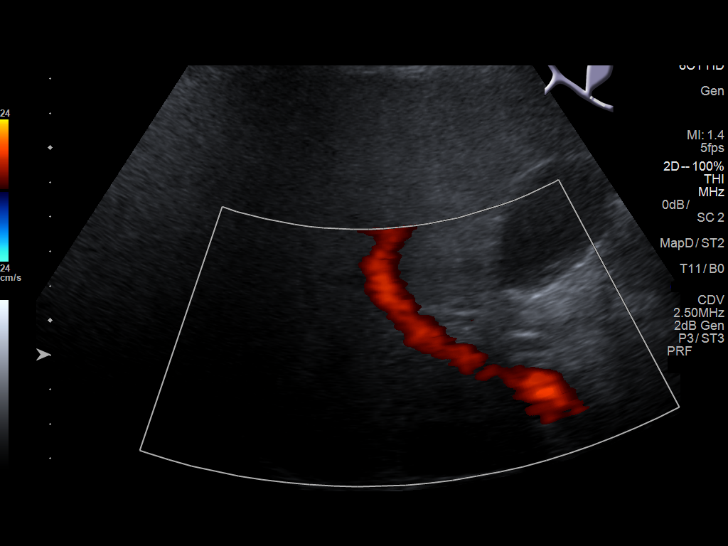
[im 19/51]
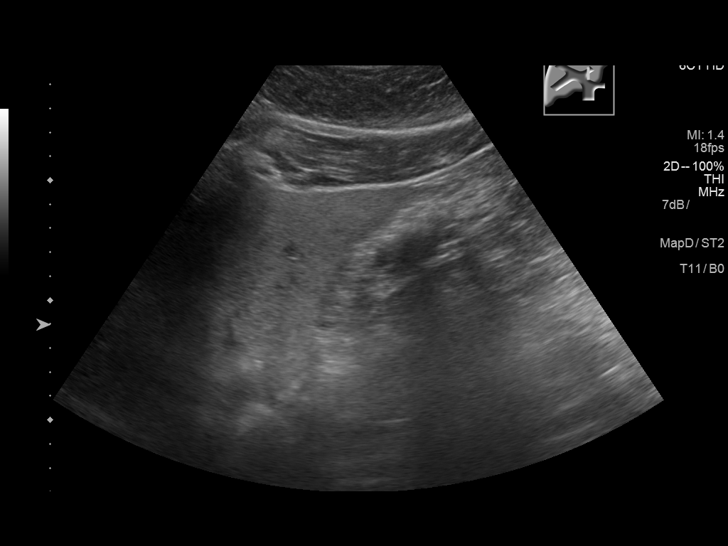
[im 23/51]
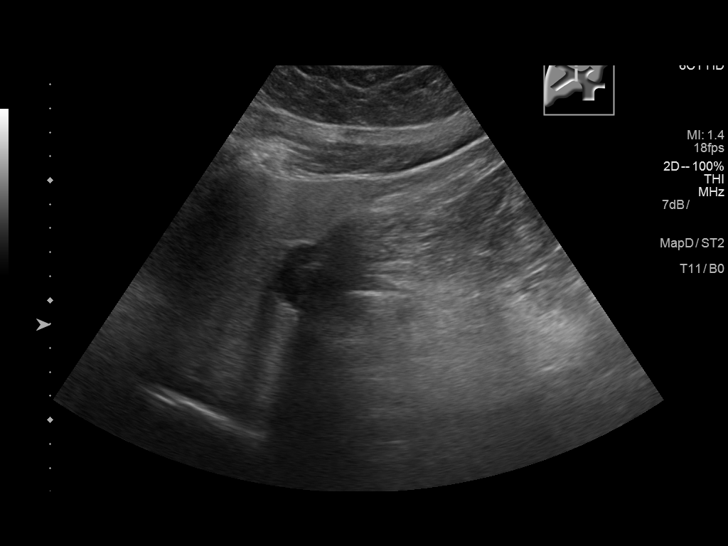
[im 28/51]
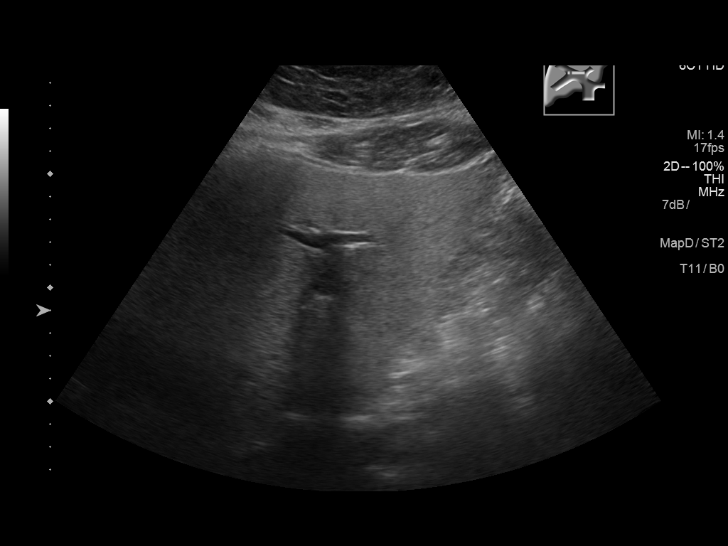
[im 32/51]
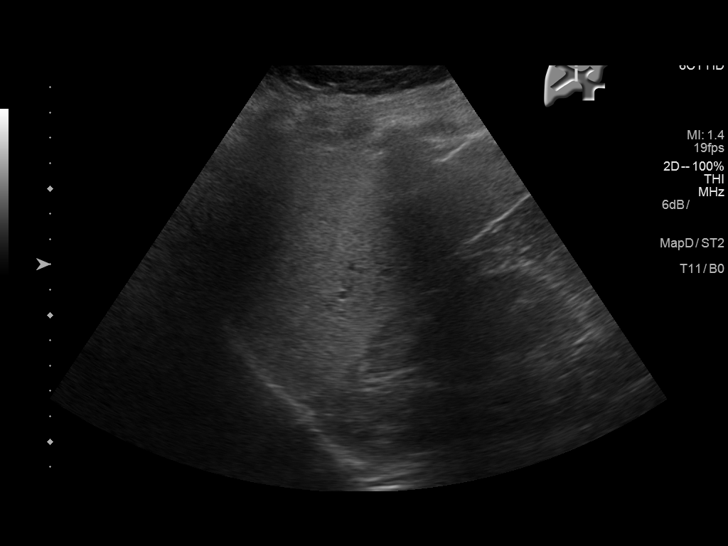
[im 34/51]
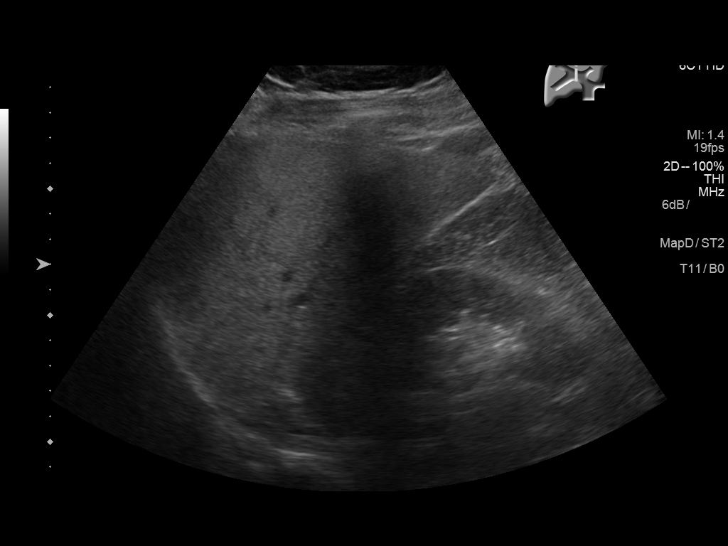
[im 38/51]
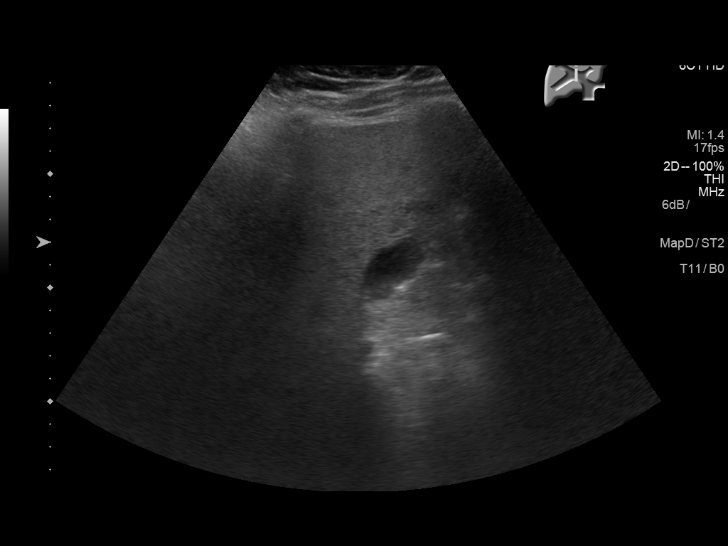
[im 42/51]
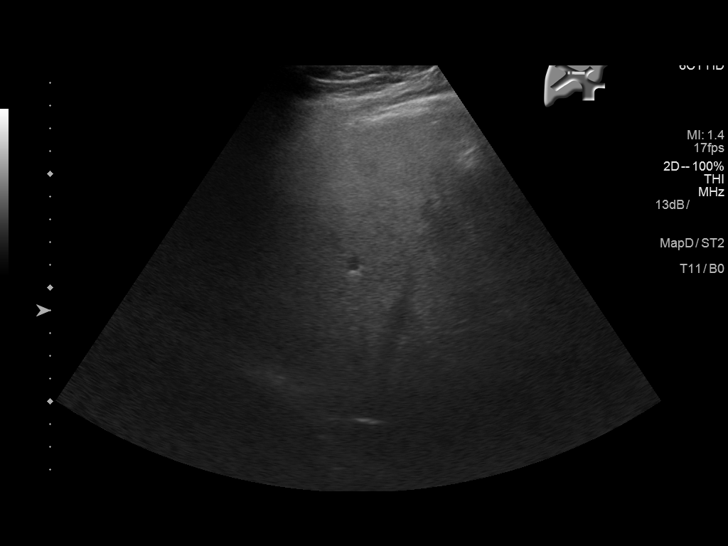
[im 46/51]
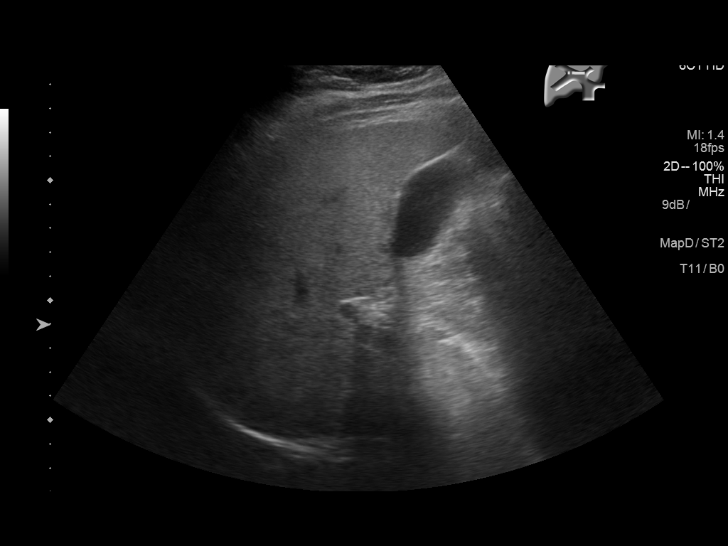
[im 51/51]
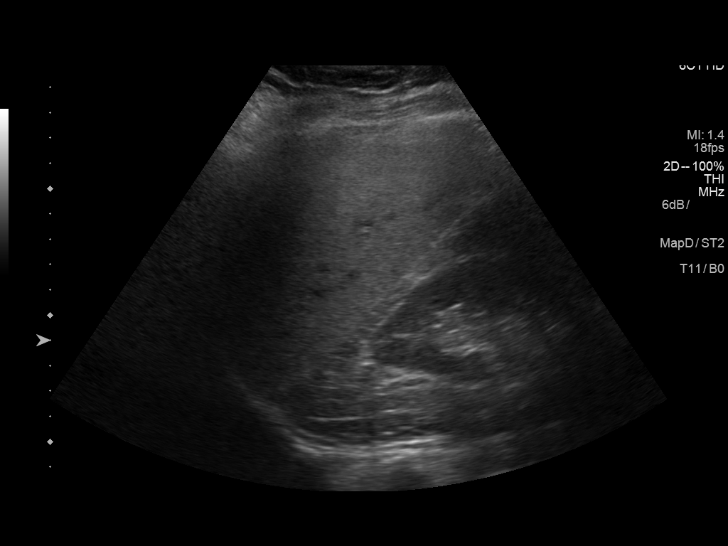

[14 of 25 positions shown; findings below may reference images not displayed]

FINDINGS: Gallbladder:

No gallstones or wall thickening visualized. There is no
pericholecystic fluid. No sonographic Murphy sign noted by
sonographer.

Common bile duct:

Diameter: 4 mm. No intrahepatic or extrahepatic biliary duct
dilatation.

Liver:

No focal lesion identified. Liver echogenicity is diffusely
increased.
IMPRESSION: Diffuse increase in liver echogenicity, a finding most likely
indicative of hepatic steatosis. While no focal liver lesions are
evident, it must be cautioned that sensitivity of ultrasound for
detection of focal liver lesions is diminished in this circumstance.
Study otherwise unremarkable.

## 2017-09-23 ENCOUNTER — Encounter: Payer: Self-pay | Admitting: Family Medicine

## 2017-09-23 ENCOUNTER — Other Ambulatory Visit (INDEPENDENT_AMBULATORY_CARE_PROVIDER_SITE_OTHER): Payer: 59

## 2017-09-23 ENCOUNTER — Other Ambulatory Visit: Payer: Self-pay | Admitting: Family Medicine

## 2017-09-23 DIAGNOSIS — Z8739 Personal history of other diseases of the musculoskeletal system and connective tissue: Secondary | ICD-10-CM | POA: Diagnosis not present

## 2017-09-23 DIAGNOSIS — E119 Type 2 diabetes mellitus without complications: Secondary | ICD-10-CM

## 2017-09-23 LAB — COMPREHENSIVE METABOLIC PANEL
ALK PHOS: 90 U/L (ref 39–117)
ALT: 79 U/L — AB (ref 0–53)
AST: 50 U/L — ABNORMAL HIGH (ref 0–37)
Albumin: 4.5 g/dL (ref 3.5–5.2)
BILIRUBIN TOTAL: 0.5 mg/dL (ref 0.2–1.2)
BUN: 12 mg/dL (ref 6–23)
CALCIUM: 9.8 mg/dL (ref 8.4–10.5)
CO2: 28 mEq/L (ref 19–32)
Chloride: 104 mEq/L (ref 96–112)
Creatinine, Ser: 1 mg/dL (ref 0.40–1.50)
GFR: 85.48 mL/min (ref >60.00)
GLUCOSE: 158 mg/dL — AB (ref 70–99)
Potassium: 4 mEq/L (ref 3.5–5.1)
Sodium: 144 mEq/L (ref 135–145)
TOTAL PROTEIN: 7 g/dL (ref 6.0–8.3)

## 2017-09-23 LAB — LIPID PANEL
Cholesterol: 132 mg/dL (ref 0–200)
HDL: 30.6 mg/dL — AB (ref >39.00)
LDL Cholesterol: 74 mg/dL (ref 0–99)
NonHDL: 100.91
Total CHOL/HDL Ratio: 4
Triglycerides: 134 mg/dL (ref 0.0–149.0)
VLDL: 26.8 mg/dL (ref 0.0–40.0)

## 2017-09-23 LAB — HEMOGLOBIN A1C: HEMOGLOBIN A1C: 7.3 % — AB (ref 4.6–6.5)

## 2017-09-23 LAB — URIC ACID: URIC ACID, SERUM: 7.1 mg/dL (ref 4.0–7.8)

## 2017-09-28 ENCOUNTER — Encounter: Payer: Self-pay | Admitting: Family Medicine

## 2017-09-28 ENCOUNTER — Ambulatory Visit (INDEPENDENT_AMBULATORY_CARE_PROVIDER_SITE_OTHER): Payer: 59 | Admitting: Family Medicine

## 2017-09-28 VITALS — BP 112/82 | HR 67 | Temp 98.6°F | Ht 68.0 in | Wt 224.2 lb

## 2017-09-28 DIAGNOSIS — Z0000 Encounter for general adult medical examination without abnormal findings: Secondary | ICD-10-CM

## 2017-09-28 DIAGNOSIS — Z7189 Other specified counseling: Secondary | ICD-10-CM

## 2017-09-28 DIAGNOSIS — E119 Type 2 diabetes mellitus without complications: Secondary | ICD-10-CM

## 2017-09-28 DIAGNOSIS — K760 Fatty (change of) liver, not elsewhere classified: Secondary | ICD-10-CM

## 2017-09-28 MED ORDER — INDOMETHACIN 50 MG PO CAPS: 50.0000 mg | ORAL_CAPSULE | Freq: Three times a day (TID) | ORAL | Status: AC | PRN

## 2017-09-28 NOTE — Patient Instructions (Signed)
Recheck in about 3 months, labs ahead of time.  Restart pravastatin.  Let me know if/when you want to get the hernia repaired.  Start walking for exercise.  Update me as needed.  Take care.  Glad to see you.

## 2017-09-28 NOTE — Progress Notes (Signed)
CPE- See plan.  Routine anticipatory guidance given to patient.  See health maintenance.  The possibility exists that previously documented standard health maintenance information may have been brought forward from a previous encounter into this note.  If needed, that same information has been updated to reflect the current situation based on today's encounter.   Tetanus 2010 Flu prev done PNA and shingles not due.  HIV screening prev done.  Colon and prostate CA screening not due.  Living will d/w pt. Wife designated if patient were incapacitated.  Diet and exercise d/w pt.  "Not good."  He needs to be in a routine, early AM, discussed.    Diabetes:  Using medications without difficulties: yes Hypoglycemic episodes:no Hyperglycemic episodes:no Feet problems:no Blood Sugars averaging: ~130s eye exam within last year: yes  No gout flares.  Labs d/w pt.    LFT elevation, off statin but still with mild inc in LFTs with known fatty liver.   No abd pain, jaundice.    PMH and SH reviewed  Meds, vitals, and allergies reviewed.   ROS: Per HPI.  Unless specifically indicated otherwise in HPI, the patient denies:  General: fever. Eyes: acute vision changes ENT: sore throat Cardiovascular: chest pain Respiratory: SOB GI: vomiting GU: dysuria Musculoskeletal: acute back pain Derm: acute rash Neuro: acute motor dysfunction Psych: worsening mood Endocrine: polydipsia Heme: bleeding Allergy: hayfever  GEN: nad, alert and oriented HEENT: mucous membranes moist NECK: supple w/o LA CV: rrr. PULM: ctab, no inc wob ABD: soft, +bs, soft umbilical hernia noted.  EXT: no edema SKIN: no acute rash  Diabetic foot exam: Normal inspection No skin breakdown No calluses  Normal DP pulses Normal sensation to light touch and monofilament Nails normal

## 2017-09-29 NOTE — Assessment & Plan Note (Signed)
Tetanus 2010 Flu prev done PNA and shingles not due.  HIV screening prev done.  Colon and prostate CA screening not due.  Living will d/w pt. Wife designated if patient were incapacitated.  Diet and exercise d/w pt.  "Not good."  He needs to be in a routine, early AM, discussed.

## 2017-09-29 NOTE — Assessment & Plan Note (Signed)
LFT elevation, off statin but still with mild inc in LFTs with known fatty liver.   No abd pain, jaundice.  Likely reasonable to restart statin.  He needs work on diet and exercise to get his diabetes under control and to help with fatty infiltration of the liver.  Discussed with patient.  Recheck periodically.  No abdominal symptoms.

## 2017-09-29 NOTE — Assessment & Plan Note (Addendum)
No change in meds.  Needs work on diet and exercise.  See discussion of fatty liver.  Normal foot exam.  Recheck periodically.

## 2017-09-29 NOTE — Assessment & Plan Note (Signed)
Living will d/w pt.  Wife designated if patient were incapacitated.   ?

## 2017-11-14 ENCOUNTER — Other Ambulatory Visit: Payer: Self-pay | Admitting: Family Medicine

## 2017-12-28 ENCOUNTER — Other Ambulatory Visit: Payer: Self-pay | Admitting: Family Medicine

## 2017-12-28 ENCOUNTER — Other Ambulatory Visit (INDEPENDENT_AMBULATORY_CARE_PROVIDER_SITE_OTHER): Payer: 59

## 2017-12-28 DIAGNOSIS — E119 Type 2 diabetes mellitus without complications: Secondary | ICD-10-CM | POA: Diagnosis not present

## 2017-12-28 LAB — HEPATIC FUNCTION PANEL
ALK PHOS: 90 U/L (ref 39–117)
ALT: 52 U/L (ref 0–53)
AST: 33 U/L (ref 0–37)
Albumin: 4.1 g/dL (ref 3.5–5.2)
BILIRUBIN DIRECT: 0.1 mg/dL (ref 0.0–0.3)
TOTAL PROTEIN: 6.8 g/dL (ref 6.0–8.3)
Total Bilirubin: 0.7 mg/dL (ref 0.2–1.2)

## 2017-12-28 LAB — HEMOGLOBIN A1C: HEMOGLOBIN A1C: 7.3 % — AB (ref 4.6–6.5)

## 2018-01-01 ENCOUNTER — Encounter: Payer: Self-pay | Admitting: Family Medicine

## 2018-01-01 ENCOUNTER — Ambulatory Visit (INDEPENDENT_AMBULATORY_CARE_PROVIDER_SITE_OTHER): Payer: 59 | Admitting: Family Medicine

## 2018-01-01 VITALS — BP 128/82 | HR 64 | Temp 98.5°F | Ht 68.0 in | Wt 224.5 lb

## 2018-01-01 DIAGNOSIS — E781 Pure hyperglyceridemia: Secondary | ICD-10-CM

## 2018-01-01 DIAGNOSIS — E119 Type 2 diabetes mellitus without complications: Secondary | ICD-10-CM

## 2018-01-01 DIAGNOSIS — K429 Umbilical hernia without obstruction or gangrene: Secondary | ICD-10-CM

## 2018-01-01 NOTE — Progress Notes (Signed)
Diabetes:  Using medications without difficulties: yes Hypoglycemic episodes:no Hyperglycemic episodes:no Feet problems:no Blood Sugars averaging: 100-130s usually eye exam within last year:yes A1c stable.  Diet and exercise d/w pt.  He is working on both, swimming more.    His diet should improve after a kitchen renovation.  D/w pt.    LFTs normalized.  D/w pt.  Back on statin now.  No GI sx, no vomiting, no abd pain, no jaundice. He has been able to tolerate statin.    Meds, vitals, and allergies reviewed.   ROS: Per HPI unless specifically indicated in ROS section   GEN: nad, alert and oriented HEENT: mucous membranes moist NECK: supple w/o LA CV: rrr. PULM: ctab, no inc wob ABD: soft, +bs, soft umbilical hernia noted.   EXT: no edema SKIN: no acute rash

## 2018-01-01 NOTE — Patient Instructions (Addendum)
Recheck in about 3-4 months.  Labs ahead of time, fasting.  Take care.  Glad to see you.  Do the best you can with diet and exercise.  Let me know if you need a referral about the umbilical hernia.

## 2018-01-03 DIAGNOSIS — K429 Umbilical hernia without obstruction or gangrene: Secondary | ICD-10-CM | POA: Insufficient documentation

## 2018-01-03 NOTE — Assessment & Plan Note (Signed)
Soft.  Not tender.  Easily reduces.  He will update me if he wants a referral for general surgery for repair at some point in the future.

## 2018-01-03 NOTE — Assessment & Plan Note (Signed)
A1c stable.  No change in meds.  Discussed with him about diet and exercise.  Recheck periodically.  He agrees.

## 2018-01-03 NOTE — Assessment & Plan Note (Signed)
LFTs normalized.  D/w pt.  Back on statin now.  No GI sx, no vomiting, no abd pain, no jaundice. He has been able to tolerate statin.  Continue as is.

## 2018-02-22 ENCOUNTER — Ambulatory Visit (INDEPENDENT_AMBULATORY_CARE_PROVIDER_SITE_OTHER)
Admission: RE | Admit: 2018-02-22 | Discharge: 2018-02-22 | Disposition: A | Discharge status: Home / Self Care | Payer: 59 | Source: Ambulatory Visit | Attending: Nurse Practitioner | Admitting: Nurse Practitioner

## 2018-02-22 ENCOUNTER — Ambulatory Visit (INDEPENDENT_AMBULATORY_CARE_PROVIDER_SITE_OTHER): Payer: 59 | Admitting: Nurse Practitioner

## 2018-02-22 ENCOUNTER — Encounter: Payer: Self-pay | Admitting: Nurse Practitioner

## 2018-02-22 VITALS — BP 110/72 | HR 78 | Ht 68.0 in | Wt 222.0 lb

## 2018-02-22 DIAGNOSIS — M25572 Pain in left ankle and joints of left foot: Secondary | ICD-10-CM

## 2018-02-22 NOTE — Progress Notes (Signed)
Name: Bryan Doyle   MRN: [id number]    DOB: 07/06/1971   Date:02/22/2018       Progress Note  Subjective  Chief Complaint  Ankle pain  HPI  Bryan Doyle is here today for evaluation of an acute complaint of left ankle pain, which began after jumping into the pool yesterday. Bryan Doyle felt a pop and sharp pain in his left ankle when his foot hit the pool floor yesterday and hes been experiencing pain and light bruising to the ankle since, although Bryan Doyle does report the pain seems somewhat improved today compared to yesterday. Bryan Doyle has broken this ankle in the past, "hairline fracture" no surgery was needed. Bryan Doyle's been resting, icing and elevating the ankle at home which has provided some pain relief. No weakness, swelling, numbness, tingling, erythema.  Patient Active Problem List   Diagnosis Date Noted  . Umbilical hernia 99/83/3825  . Fatty liver 10/05/2016  . Cough 08/02/2014  . Advance care planning 03/24/2014  . Plantar fasciitis 03/21/2012  . Routine general medical examination at a health care facility 03/18/2012  . History of gout 07/10/2011  . CLOSED FRACTURE OF NAVICULAR BONE OF FOOT 06/20/2009  . ANKLE PAIN, LEFT 05/28/2009  . Diabetes mellitus without complication ([place]) 05/39/7673  . HYPERTRIGLYCERIDEMIA 08/02/2007  . Allergic rhinitis 08/02/2007  . NONSPEC ELEVATION OF LEVELS OF TRANSAMINASE/LDH 08/02/2007    Social History   Tobacco Use  . Smoking status: Never Smoker  . Smokeless tobacco: Never Used  Substance Use Topics  . Alcohol use: Yes    Alcohol/week: 0.0 standard drinks    Comment: rarely     Current Outpatient Medications:  .  Ascorbic Acid (VITAMIN C) 500 MG tablet, Take 500 mg by mouth daily.  , Disp: , Rfl:  .  Beclomethasone Diprop, Nasal, (QNASL NA), Place into the nose daily., Disp: , Rfl:  .  FREESTYLE LITE test strip, TEST BLOOD SUGAR ONCE A DAY AND AS DIRECTED, Disp: 100 each, Rfl: 3 .  indomethacin (INDOCIN) 50 MG capsule, Take 1 capsule (50  mg total) by mouth 3 (three) times daily as needed (with food)., Disp: , Rfl:  .  Lancets (FREESTYLE) lancets, Check blood sugar once daily and as directed. Dx E11.9, Disp: 100 each, Rfl: 3 .  lisinopril (PRINIVIL,ZESTRIL) 2.5 MG tablet, TAKE 1 TABLET DAILY, Disp: 90 tablet, Rfl: 3 .  metFORMIN (GLUCOPHAGE) 500 MG tablet, TAKE 1 TABLET TWICE A DAY WITH MEALS, Disp: 180 tablet, Rfl: 1 .  pravastatin (PRAVACHOL) 10 MG tablet, TAKE 1 TABLET DAILY, Disp: 90 tablet, Rfl: 3  Allergies  Allergen Reactions  . Penicillins     REACTION: u/k- was told in infancy to avoid  . Sulfa Antibiotics Rash    Severe rash and fever    ROS  No other specific complaints in a complete review of systems (except as listed in HPI above).  Objective  Vitals:   02/22/18 1026  BP: 110/72  Pulse: 78  SpO2: 96%  Weight: 222 lb (100.7 kg)  Height: 5\' 8"  (1.727 m)    Body mass index is 33.75 kg/m.  Nursing Note and Vital Signs reviewed.  Physical Exam  Constitutional: Patient appears well-developed and well-nourished. No distress.  HEENT: head atraumatic, normocephalic, pupils equal and reactive to light, EOM's intact, neck supple, oropharynx pink and moist without exudate Cardiovascular: Normal rate, regular rhythm, distal pulses intact. No BLE edema. Pulmonary/Chest: Effort normal, No respiratory distress or retractions. Musculoskeletal:  No gross deformities, normal range of motion. Light  bruising noted to anterior aspect of left ankle Neurological: Bryan Doyle is alert and oriented to person, place, and time. No cranial nerve deficit. Coordination, balance, strength, speech and gait are normal.  Skin: Skin is warm and dry. No rash noted. No erythema.  Psychiatric: Patient has a normal mood and affect. behavior is normal. Judgment and thought content normal.   Assessment & Plan  1. Acute left ankle pain Imaging ordered today was negative Home management, Red flags and when to present for emergency care or  RTC including new/worsening/un-resolving symptoms  reviewed with patient at time of visit. Follow up and care instructions discussed and provided in AVS. - DG Ankle Complete Left; Future

## 2018-02-22 NOTE — Patient Instructions (Signed)
Your ankle xray is normal Please continue rest, ice and elevation as you have been doing, wear supportive shoes, can use OTC tylenol or ibuprofen for pain. If your symptoms are not improved after 2 weeks, please follow up for further evaluation.   Ankle Pain Many things can cause ankle pain, including an injury to the area and overuse of the ankle.The ankle joint holds your body weight and allows you to move around. Ankle pain can occur on either side or the back of one ankle or both ankles. Ankle pain may be sharp and burning or dull and aching. There may be tenderness, stiffness, redness, or warmth around the ankle. Follow these instructions at home: Activity  Rest your ankle as told by your health care provider. Avoid any activities that cause ankle pain.  Do exercises as told by your health care provider.  Ask your health care provider if you can drive. Using a brace, a bandage, or crutches  If you were given a brace: ? Wear it as told by your health care provider. ? Remove it when you take a bath or a shower. ? Try not to move your ankle very much, but wiggle your toes from time to time. This helps to prevent swelling.  If you were given an elastic bandage: ? Remove it when you take a bath or a shower. ? Try not to move your ankle very much, but wiggle your toes from time to time. This helps to prevent swelling. ? Adjust the bandage to make it more comfortable if it feels too tight. ? Loosen the bandage if you have numbness or tingling in your foot or if your foot turns cold and blue.  If you have crutches, use them as told by your health care provider. Continue to use them until you can walk without feeling pain in your ankle. Managing pain, stiffness, and swelling  Raise (elevate) your ankle above the level of your heart while you are sitting or lying down.  If directed, apply ice to the area: ? Put ice in a plastic bag. ? Place a towel between your skin and the  bag. ? Leave the ice on for 20 minutes, 2-3 times per day. General instructions  Keep all follow-up visits as told by your health care provider. This is important.  Record this information that may be helpful for you and your health care provider: ? How often you have ankle pain. ? Where the pain is located. ? What the pain feels like.  Take over-the-counter and prescription medicines only as told by your health care provider. Contact a health care provider if:  Your pain gets worse.  Your pain is not relieved with medicines.  You have a fever or chills.  You are having more trouble with walking.  You have new symptoms. Get help right away if:  Your foot, leg, toes, or ankle tingles or becomes numb.  Your foot, leg, toes, or ankle becomes swollen.  Your foot, leg, toes, or ankle turns pale or blue. This information is not intended to replace advice given to you by your health care provider. Make sure you discuss any questions you have with your health care provider. Document Released: 12/11/2009 Document Revised: 02/22/2016 Document Reviewed: 01/23/2015 Elsevier Interactive Patient Education  [employer].

## 2018-02-23 ENCOUNTER — Ambulatory Visit: Payer: 59 | Admitting: Family Medicine

## 2018-04-20 ENCOUNTER — Other Ambulatory Visit (INDEPENDENT_AMBULATORY_CARE_PROVIDER_SITE_OTHER): Payer: 59

## 2018-04-20 DIAGNOSIS — E119 Type 2 diabetes mellitus without complications: Secondary | ICD-10-CM

## 2018-04-20 LAB — COMPREHENSIVE METABOLIC PANEL
ALBUMIN: 4 g/dL (ref 3.5–5.2)
ALT: 57 U/L — AB (ref 0–53)
AST: 33 U/L (ref 0–37)
Alkaline Phosphatase: 85 U/L (ref 39–117)
BILIRUBIN TOTAL: 0.6 mg/dL (ref 0.2–1.2)
BUN: 10 mg/dL (ref 6–23)
CALCIUM: 9 mg/dL (ref 8.4–10.5)
CO2: 31 meq/L (ref 19–32)
Chloride: 104 mEq/L (ref 96–112)
Creatinine, Ser: 0.97 mg/dL (ref 0.40–1.50)
GFR: 88.31 mL/min (ref >60.00)
Glucose, Bld: 131 mg/dL — ABNORMAL HIGH (ref 70–99)
Potassium: 3.9 mEq/L (ref 3.5–5.1)
Sodium: 140 mEq/L (ref 135–145)
Total Protein: 6.7 g/dL (ref 6.0–8.3)

## 2018-04-20 LAB — HEMOGLOBIN A1C: HEMOGLOBIN A1C: 6.9 % — AB (ref 4.6–6.5)

## 2018-04-20 LAB — LIPID PANEL
CHOL/HDL RATIO: 4
Cholesterol: 125 mg/dL (ref 0–200)
HDL: 29 mg/dL — AB (ref >39.00)
LDL Cholesterol: 72 mg/dL (ref 0–99)
NonHDL: 95.57
TRIGLYCERIDES: 116 mg/dL (ref 0.0–149.0)
VLDL: 23.2 mg/dL (ref 0.0–40.0)

## 2018-04-23 ENCOUNTER — Encounter: Payer: Self-pay | Admitting: Family Medicine

## 2018-04-23 ENCOUNTER — Ambulatory Visit (INDEPENDENT_AMBULATORY_CARE_PROVIDER_SITE_OTHER): Payer: 59 | Admitting: Family Medicine

## 2018-04-23 VITALS — BP 140/80 | HR 78 | Temp 97.9°F | Ht 68.0 in | Wt 220.0 lb

## 2018-04-23 DIAGNOSIS — K760 Fatty (change of) liver, not elsewhere classified: Secondary | ICD-10-CM

## 2018-04-23 DIAGNOSIS — E781 Pure hyperglyceridemia: Secondary | ICD-10-CM

## 2018-04-23 DIAGNOSIS — E119 Type 2 diabetes mellitus without complications: Secondary | ICD-10-CM

## 2018-04-23 NOTE — Progress Notes (Signed)
Diabetes:  Using medications without difficulties: yes Hypoglycemic episodes: rare, cautions d/w pt.  He keeps a snack nearby Hyperglycemic episodes:no Feet problems:no Blood Sugars averaging: 100-125 usually.  eye exam within last year: due, pending.  D/w pt.    Elevated Cholesterol: Using medications without problems:yes Muscle aches: no Diet compliance: yes, encouraged.  Exercise: yes, encouraged  Minimal LFT elevation attributed to fatty liver.  D/w pt.    Flu shot done at pharmacy yesterday.    Meds, vitals, and allergies reviewed.   ROS: Per HPI unless specifically indicated in ROS section   GEN: nad, alert and oriented HEENT: mucous membranes moist NECK: supple w/o LA CV: rrr. PULM: ctab, no inc wob ABD: soft, +bs EXT: no edema SKIN: no acute rash

## 2018-04-23 NOTE — Patient Instructions (Addendum)
Recheck in about 3-4 months.  The only lab you need to have done for your next diabetic visit is an A1c.  We can do this with a fingerstick test at the office visit.  You do not need a lab visit ahead of time for this.  It does not matter if you are fasting when the lab is done.   Take care.  Glad to see you.  Update me as needed.   Thanks for getting a flu shot.

## 2018-04-25 NOTE — Assessment & Plan Note (Signed)
LFTs discussed with patient.  Needs to continue work on diet and exercise.  No abdominal pain.  No vomiting.  No jaundice.

## 2018-04-25 NOTE — Assessment & Plan Note (Signed)
Lipids are reasonable, except for low HDL.  Needs work on diet and exercise.  Able to tolerate statin.  No change in meds at this point.  He agrees.

## 2018-04-25 NOTE — Assessment & Plan Note (Signed)
A1c controlled.  Needs continued work on diet and exercise.  Labs discussed with patient.  Hypoglycemia cautions discussed with patient.  Recheck periodically.  He agrees.

## 2018-05-14 LAB — HM DIABETES EYE EXAM

## 2018-05-15 ENCOUNTER — Other Ambulatory Visit: Payer: Self-pay | Admitting: Family Medicine

## 2018-05-16 ENCOUNTER — Other Ambulatory Visit: Payer: Self-pay | Admitting: Family Medicine

## 2018-05-20 ENCOUNTER — Encounter: Payer: Self-pay | Admitting: Family Medicine

## 2018-07-30 ENCOUNTER — Telehealth: Payer: Self-pay | Admitting: *Deleted

## 2018-07-30 ENCOUNTER — Ambulatory Visit (INDEPENDENT_AMBULATORY_CARE_PROVIDER_SITE_OTHER): Payer: 59 | Admitting: Family Medicine

## 2018-07-30 ENCOUNTER — Encounter: Payer: Self-pay | Admitting: Family Medicine

## 2018-07-30 VITALS — BP 116/84 | HR 69 | Temp 98.4°F | Ht 68.0 in | Wt 221.8 lb

## 2018-07-30 DIAGNOSIS — E119 Type 2 diabetes mellitus without complications: Secondary | ICD-10-CM

## 2018-07-30 LAB — POCT GLYCOSYLATED HEMOGLOBIN (HGB A1C): Hemoglobin A1C: 6.9 % — AB (ref 4.0–5.6)

## 2018-07-30 MED ORDER — METFORMIN HCL 500 MG PO TABS: 500.0000 mg | ORAL_TABLET | Freq: Every day | ORAL | Status: DC

## 2018-07-30 MED ORDER — OSELTAMIVIR PHOSPHATE 75 MG PO CAPS: 75.0000 mg | ORAL_CAPSULE | Freq: Every day | ORAL | 0 refills | Status: DC

## 2018-07-30 MED ORDER — LISINOPRIL 2.5 MG PO TABS: 2.5000 mg | ORAL_TABLET | Freq: Every day | ORAL | 3 refills | Status: DC

## 2018-07-30 NOTE — Assessment & Plan Note (Signed)
A1c controlled.  No change in meds. Continue work on diet and exercise, d/w pt.  Recheck in a few months with labs prior to cpe.  He agrees.

## 2018-07-30 NOTE — Progress Notes (Signed)
Diabetes:  Using medications without difficulties:yes, taking metformin 500mg  a day.   Hypoglycemic episodes: no Hyperglycemic episodes: no Feet problems: no Blood Sugars averaging: usually ~100-120s eye exam within last year: yes A1c 6.9, d/w pt at [facility].  Stable.   Diet and exercise d/w pt.  He is working on both.  He was biking when the weather was better.  The family was riding together.  Diet is "okay."    His boss retired at work.  He picked up a few more responsibilities.  His family is doing well.   Meds, vitals, and allergies reviewed.   ROS: Per HPI unless specifically indicated in ROS section   GEN: nad, alert and oriented HEENT: mucous membranes moist NECK: supple w/o LA CV: rrr. PULM: ctab, no inc wob ABD: soft, +bs EXT: no edema SKIN: well perfused.

## 2018-07-30 NOTE — Telephone Encounter (Signed)
Sent. Take daily.  If he has flu sx, then change to BID dosing.  Thanks.

## 2018-07-30 NOTE — Patient Instructions (Addendum)
Don't change your meds for now.  Update me as needed.  Take care.  Glad to see you.   Recheck labs prior to a physical in about 3-4 months.  Thanks for your effort.

## 2018-07-30 NOTE — Telephone Encounter (Signed)
Patient advised.

## 2018-07-30 NOTE — Telephone Encounter (Signed)
Spoke to pt who states his wife was Dx with FluA today and he is requesting a prophylactic dose of tamilfu be sent to [facility] if Dr Damita Dunnings feels necessary. pls advise

## 2018-11-09 ENCOUNTER — Other Ambulatory Visit: Payer: 59

## 2018-11-11 ENCOUNTER — Encounter: Payer: 59 | Admitting: Family Medicine

## 2018-11-12 ENCOUNTER — Other Ambulatory Visit: Payer: Self-pay | Admitting: Family Medicine

## 2019-02-10 ENCOUNTER — Other Ambulatory Visit: Payer: Self-pay | Admitting: Family Medicine

## 2019-02-11 ENCOUNTER — Other Ambulatory Visit: Payer: 59

## 2019-02-14 ENCOUNTER — Encounter: Payer: 59 | Admitting: Family Medicine

## 2019-02-22 ENCOUNTER — Other Ambulatory Visit: Payer: Self-pay | Admitting: Family Medicine

## 2019-02-22 DIAGNOSIS — E119 Type 2 diabetes mellitus without complications: Secondary | ICD-10-CM

## 2019-02-23 ENCOUNTER — Telehealth: Payer: Self-pay

## 2019-02-23 ENCOUNTER — Other Ambulatory Visit (INDEPENDENT_AMBULATORY_CARE_PROVIDER_SITE_OTHER): Payer: 59

## 2019-02-23 ENCOUNTER — Other Ambulatory Visit: Payer: Self-pay

## 2019-02-23 DIAGNOSIS — E119 Type 2 diabetes mellitus without complications: Secondary | ICD-10-CM | POA: Diagnosis not present

## 2019-02-23 LAB — COMPREHENSIVE METABOLIC PANEL
ALT: 66 U/L — ABNORMAL HIGH (ref 0–53)
AST: 34 U/L (ref 0–37)
Albumin: 3.9 g/dL (ref 3.5–5.2)
Alkaline Phosphatase: 108 U/L (ref 39–117)
BUN: 10 mg/dL (ref 6–23)
CO2: 28 mEq/L (ref 19–32)
Calcium: 9 mg/dL (ref 8.4–10.5)
Chloride: 103 mEq/L (ref 96–112)
Creatinine, Ser: 0.93 mg/dL (ref 0.40–1.50)
GFR: 86.91 mL/min (ref >60.00)
Glucose, Bld: 194 mg/dL — ABNORMAL HIGH (ref 70–99)
Potassium: 4.2 mEq/L (ref 3.5–5.1)
Sodium: 138 mEq/L (ref 135–145)
Total Bilirubin: 0.7 mg/dL (ref 0.2–1.2)
Total Protein: 6.3 g/dL (ref 6.0–8.3)

## 2019-02-23 LAB — LIPID PANEL
Cholesterol: 119 mg/dL (ref 0–200)
HDL: 26.8 mg/dL — ABNORMAL LOW (ref >39.00)
LDL Cholesterol: 69 mg/dL (ref 0–99)
NonHDL: 92.62
Total CHOL/HDL Ratio: 4
Triglycerides: 120 mg/dL (ref 0.0–149.0)
VLDL: 24 mg/dL (ref 0.0–40.0)

## 2019-02-23 LAB — HEMOGLOBIN A1C: Hgb A1c MFr Bld: 8.5 % — ABNORMAL HIGH (ref 4.6–6.5)

## 2019-02-23 NOTE — Telephone Encounter (Signed)
Per lab tab pt has already had labs collected.

## 2019-02-23 NOTE — Telephone Encounter (Signed)
[place] Night - Client Nonclinical Telephone Record AccessNurse Client [place] Night - Client Client Site [facility] Physician Renford Dills - MD Contact Type Call Who Is Calling Patient / Member / Family / Caregiver Caller Name Peggy Monk Caller Phone Number [phone] Patient Name Jef Futch Patient DOB 1970-09-24 Call Type Message Only Information Provided Reason for Call Request for General Office Information Initial Comment Caller is at Lab door to get labs done at 0700. Additional Comment Call Closed By: Silvano Rusk Transaction Date/Time: 02/23/2019 7:30:10 AM (ET

## 2019-02-28 ENCOUNTER — Other Ambulatory Visit: Payer: Self-pay

## 2019-02-28 ENCOUNTER — Ambulatory Visit (INDEPENDENT_AMBULATORY_CARE_PROVIDER_SITE_OTHER): Payer: 59 | Admitting: Family Medicine

## 2019-02-28 ENCOUNTER — Encounter: Payer: Self-pay | Admitting: Family Medicine

## 2019-02-28 VITALS — BP 128/84 | HR 80 | Temp 97.6°F | Ht 68.0 in | Wt 225.4 lb

## 2019-02-28 DIAGNOSIS — E119 Type 2 diabetes mellitus without complications: Secondary | ICD-10-CM

## 2019-02-28 DIAGNOSIS — K760 Fatty (change of) liver, not elsewhere classified: Secondary | ICD-10-CM

## 2019-02-28 DIAGNOSIS — Z23 Encounter for immunization: Secondary | ICD-10-CM

## 2019-02-28 DIAGNOSIS — Z7189 Other specified counseling: Secondary | ICD-10-CM

## 2019-02-28 DIAGNOSIS — Z0000 Encounter for general adult medical examination without abnormal findings: Secondary | ICD-10-CM

## 2019-02-28 DIAGNOSIS — K429 Umbilical hernia without obstruction or gangrene: Secondary | ICD-10-CM

## 2019-02-28 DIAGNOSIS — E781 Pure hyperglyceridemia: Secondary | ICD-10-CM

## 2019-02-28 NOTE — Progress Notes (Signed)
CPE- See plan.  Routine anticipatory guidance given to patient.  See health maintenance.  The possibility exists that previously documented standard health maintenance information may have been brought forward from a previous encounter into this note.  If needed, that same information has been updated to reflect the current situation based on today's encounter.    Tetanus 2020 Flu to be done at pharmacy.   PNA and shingles not due.  HIV screening prev done.  Colon and prostate CA screening not due.  Living will d/w pt. Wife designated if patient were incapacitated.  Diet and exercise d/w pt. "Terrible" on both. Discussed options.    Diabetes:  Using medications without difficulties: yes Hypoglycemic episodes:no Hyperglycemic episodes:no Feet problems:no Blood Sugars averaging:  ~120s-140s usually on Am check, higher after higher carb foods.  eye exam within last year: yes  Chest pain with exertion:no Edema:no Short of breath:no  Elevated Cholesterol: Using medications without problems: yes Muscle aches: no Diet compliance: encouraged.  Exercise: encouraged.    LFT elevation.  D/w pt about diet and exercise and labs.  H/o fatty liver.  We talked about tapering carbs, cutting out/limiting soda.    He wanted to talk to gen surgery about his umbilical hernia. Not tender but bulging.    PMH and SH reviewed  Meds, vitals, and allergies reviewed.   ROS: Per HPI.  Unless specifically indicated otherwise in HPI, the patient denies:  General: fever. Eyes: acute vision changes ENT: sore throat Cardiovascular: chest pain Respiratory: SOB GI: vomiting GU: dysuria Musculoskeletal: acute back pain Derm: acute rash Neuro: acute motor dysfunction Psych: worsening mood Endocrine: polydipsia Heme: bleeding Allergy: hayfever  GEN: nad, alert and oriented HEENT: ncat NECK: supple w/o LA CV: rrr. PULM: ctab, no inc wob ABD: soft, +bs, soft umbilical hernia noted. EXT: no  edema SKIN: no acute rash  Diabetic foot exam: Normal inspection No skin breakdown No calluses  Normal DP pulses Normal sensation to light touch and monofilament Nails normal

## 2019-02-28 NOTE — Patient Instructions (Addendum)
We'll call about the general surgery appointment.  Cut back on soda and recheck labs prior to a visit in about 3 months.   Take care.  Glad to see you.

## 2019-03-02 NOTE — Assessment & Plan Note (Signed)
Refer

## 2019-03-02 NOTE — Assessment & Plan Note (Addendum)
Tetanus 2020 Flu to be done at pharmacy.   PNA and shingles not due.  HIV screening prev done.  Colon and prostate CA screening not due.  Living will d/w pt. Wife designated if patient were incapacitated.  Diet and exercise d/w pt. "Terrible" on both. Discussed options.

## 2019-03-02 NOTE — Assessment & Plan Note (Signed)
Living will d/w pt.  Wife designated if patient were incapacitated.   ?

## 2019-03-02 NOTE — Assessment & Plan Note (Signed)
Need to continue to work on diet and exercise.  Discussed.  Labs discussed.  He agrees.

## 2019-03-02 NOTE — Assessment & Plan Note (Signed)
Labs discussed with patient.  Diet and exercise discussed.  Cut back on soda.  He will keep checking his sugar, plan on recheck in 3 months.  He agrees.  Update me as needed in the meantime.  He agrees.

## 2019-03-02 NOTE — Assessment & Plan Note (Signed)
LFT elevation.  D/w pt about diet and exercise and labs.  H/o fatty liver.  We talked about tapering carbs, cutting out/limiting soda.

## 2019-03-11 ENCOUNTER — Encounter: Payer: Self-pay | Admitting: Family Medicine

## 2019-03-15 ENCOUNTER — Other Ambulatory Visit: Payer: Self-pay

## 2019-03-15 ENCOUNTER — Encounter: Payer: Self-pay | Admitting: General Surgery

## 2019-03-15 ENCOUNTER — Ambulatory Visit (INDEPENDENT_AMBULATORY_CARE_PROVIDER_SITE_OTHER): Payer: 59 | Admitting: General Surgery

## 2019-03-15 VITALS — BP 158/89 | HR 76 | Temp 97.7°F | Ht 68.0 in | Wt 226.0 lb

## 2019-03-15 DIAGNOSIS — K420 Umbilical hernia with obstruction, without gangrene: Secondary | ICD-10-CM

## 2019-03-15 NOTE — Progress Notes (Signed)
Patient ID: Bryan Doyle, male   DOB: 1971-04-24, 48 y.o.   MRN: [id number]  Chief Complaint  Patient presents with  . New Patient (Initial Visit)    Umbilical Hernia    HPI Bryan Doyle is a 48 y.o. male.   He was referred by his primary care provider, Dr. Damita Dunnings, for surgical evaluation of an umbilical hernia.  He states that it is been present for many years.  It does not bother him.  It is not painful.  He has never tried to reduce it, so he is not sure if it is possible.  He has never had any nausea, vomiting, or difficulty with bowel movements that he can relate to a hernia.  He states that it bulges, and that is about it.   Past Medical History:  Diagnosis Date  . Allergy   . Asthma    exercise induced - as child. no current issues  . Diabetes mellitus   . Gout   . Headache    sinus  . Hyperlipidemia     Past Surgical History:  Procedure Laterality Date  . ETHMOIDECTOMY Right 05/16/2015   Procedure: ETHMOIDECTOMY- TOTAL;  Surgeon: Carloyn Manner, MD;  Location: [place];  Service: ENT;  Laterality: Right;  . HERNIA REPAIR     bilateral   . IMAGE GUIDED SINUS SURGERY N/A 05/16/2015   Procedure: IMAGE GUIDED SINUS SURGERY;  Surgeon: Carloyn Manner, MD;  Location: [place];  Service: ENT;  Laterality: N/A;  GAVE DISK TO CE[facility] Diabetic - oral meds  . MAXILLARY ANTROSTOMY Bilateral 05/16/2015   Procedure: MAXILLARY ANTROSTOMY;  Surgeon: Carloyn Manner, MD;  Location: [place];  Service: ENT;  Laterality: Bilateral;  . SEPTOPLASTY N/A 05/16/2015   Procedure: SEPTOPLASTY;  Surgeon: Carloyn Manner, MD;  Location: [place];  Service: ENT;  Laterality: N/A;  . TURBINATE REDUCTION Bilateral 05/16/2015   Procedure: INFERIOR TURBINATE REDUCTION;  Surgeon: Carloyn Manner, MD;  Location: [place];  Service: ENT;  Laterality: Bilateral;  . VASECTOMY  2013   Dr. Jacqlyn Larsen    Family History  Problem Relation Age of  Onset  . COPD Mother   . Cancer Mother        breast  . Diabetes Father        type 1  . Hyperlipidemia Father   . Hypertension Father   . Cancer Paternal Aunt   . Diabetes Maternal Grandfather   . Prostate cancer Maternal Grandfather   . Colon cancer Neg Hx     Social History Social History   Tobacco Use  . Smoking status: Never Smoker  . Smokeless tobacco: Never Used  Substance Use Topics  . Alcohol use: Yes    Alcohol/week: 0.0 standard drinks    Comment: rarely  . Drug use: No    Allergies  Allergen Reactions  . Penicillins     REACTION: u/k- was told in infancy to avoid  . Sulfa Antibiotics Rash    Severe rash and fever    Current Outpatient Medications  Medication Sig Dispense Refill  . Ascorbic Acid (VITAMIN C) 500 MG tablet Take 500 mg by mouth daily.      Bryan Doyle FREESTYLE LITE test strip TEST BLOOD SUGAR ONCE A DAY AND AS DIRECTED 100 each 3  . indomethacin (INDOCIN) 50 MG capsule Take 1 capsule (50 mg total) by mouth 3 (three) times daily as needed (with food).    . Lancets (FREESTYLE) lancets Check blood sugar once daily  and as directed. Dx E11.9 100 each 3  . lisinopril (PRINIVIL,ZESTRIL) 2.5 MG tablet Take 1 tablet (2.5 mg total) by mouth daily. 90 tablet 3  . metFORMIN (GLUCOPHAGE) 500 MG tablet TAKE 1 TABLET DAILY WITH BREAKFAST 90 tablet 2  . pravastatin (PRAVACHOL) 10 MG tablet TAKE 1 TABLET DAILY 90 tablet 3   No current facility-administered medications for this visit.     Review of Systems Review of Systems  All other systems reviewed and are negative.   Blood pressure (!) 158/89, pulse 76, temperature 97.7 F (36.5 C), height 5\' 8"  (1.727 m), weight 226 lb (102.5 kg), SpO2 97 %. Today's Vitals   03/15/19 0955  BP: (!) 158/89  Pulse: 76  Temp: 97.7 F (36.5 C)  SpO2: 97%  Weight: 226 lb (102.5 kg)  Height: 5\' 8"  (1.727 m)   Body mass index is 34.36 kg/m.  Physical Exam Physical Exam Constitutional:      General: He is not in acute  distress.    Appearance: He is obese.  HENT:     Head: Normocephalic and atraumatic.     Nose:     Comments: Covered with a mask, secondary to COVID-19 precautions    Mouth/Throat:     Comments: Covered with a mask, secondary to COVID-19 precautions Eyes:     General: No scleral icterus.       Right eye: No discharge.        Left eye: No discharge.     Conjunctiva/sclera: Conjunctivae normal.  Neck:     Musculoskeletal: Normal range of motion. No neck rigidity.     Comments: No thyromegaly or dominant thyroid masses appreciated. Cardiovascular:     Rate and Rhythm: Normal rate and regular rhythm.     Pulses: Normal pulses.     Heart sounds: No murmur.  Pulmonary:     Effort: Pulmonary effort is normal. No respiratory distress.     Breath sounds: Normal breath sounds.  Abdominal:     General: Bowel sounds are normal.     Palpations: Abdomen is soft.     Hernia: A hernia is present. Hernia is present in the umbilical area.       Comments: Protuberant, consistent with his level of obesity.  There is a visible umbilical hernia present.  The overlying skin is slightly reddened and thinned.  On palpation, it feels to have omentum within it.  I attempted to reduce it in clinic.  I was able to get part of the hernia reduced, but it did not completely reduce.  The hernia defect seems small, however I was not able to palpate the fascial edges.  Genitourinary:    Comments: Deferred Musculoskeletal: Normal range of motion.        General: No swelling or tenderness.  Lymphadenopathy:     Cervical: No cervical adenopathy.  Skin:    General: Skin is warm and dry.  Neurological:     General: No focal deficit present.     Mental Status: He is alert and oriented to person, place, and time.  Psychiatric:        Mood and Affect: Mood normal.        Behavior: Behavior normal.     Data Reviewed I reviewed Dr. Josefine Class note from the clinic visit of 28 February 2019.  No specific details  relevant to this visit, aside from the referral to surgery for the umbilical hernia.  I have reviewed his labs, specifically his hemoglobin A1c values for  the past year.  These have ranged from 6.9 to the most recent value of 8.5, suggesting inadequate glucose control.  Assessment This is a 48 year old male with a longstanding umbilical hernia.  Although it does not bother him, it is incarcerated.  I recommended that he undergo surgical management.  Plan I discussed the risks of the operation with him.  These include, but are not limited to, leading, infection, injury to bowel, need to resect bowel, need to use mesh, mesh complications, pain, and hernia recurrence.  He had the opportunity to ask any questions and these were all answered to his satisfaction.  We will schedule him for surgery at a mutually agreeable date and time.    Fredirick Maudlin 03/15/2019, 10:54 AM

## 2019-03-15 NOTE — Patient Instructions (Addendum)
You have requested for your Umbilical Hernia be repaired. This will be scheduled with Dr. Celine Ahr at [place] will need to arrange to be off work for 1-2 weeks but will have to have a lifting restriction of no more than 15 lbs for 6 weeks following your surgery.   Umbilical Hernia, Adult A hernia is a bulge of tissue that pushes through an opening between muscles. An umbilical hernia happens in the abdomen, near the belly button (umbilicus). The hernia may contain tissues from the small intestine, large intestine, or fatty tissue covering the intestines (omentum). Umbilical hernias in adults tend to get worse over time, and they require surgical treatment. There are several types of umbilical hernias. You may have:  A hernia located just above or below the umbilicus (indirect hernia). This is the most common type of umbilical hernia in adults.  A hernia that forms through an opening formed by the umbilicus (direct hernia).  A hernia that comes and goes (reducible hernia). A reducible hernia may be visible only when you strain, lift something heavy, or cough. This type of hernia can be pushed back into the abdomen (reduced).  A hernia that traps abdominal tissue inside the hernia (incarcerated hernia). This type of hernia cannot be reduced.  A hernia that cuts off blood flow to the tissues inside the hernia (strangulated hernia). The tissues can start to die if this happens. This type of hernia requires emergency treatment.  What are the causes? An umbilical hernia happens when tissue inside the abdomen presses on a weak area of the abdominal muscles. What increases the risk? You may have a greater risk of this condition if you:  Are obese.  Have had several pregnancies.  Have a buildup of fluid inside your abdomen (ascites).  Have had surgery that weakens the abdominal muscles.  What are the signs or symptoms? The main symptom of this condition is a painless bulge at or  near the belly button. A reducible hernia may be visible only when you strain, lift something heavy, or cough. Other symptoms may include:  Dull pain.  A feeling of pressure.  Symptoms of a strangulated hernia may include:  Pain that gets increasingly worse.  Nausea and vomiting.  Pain when pressing on the hernia.  Skin over the hernia becoming red or purple.  Constipation.  Blood in the stool.  How is this diagnosed? This condition may be diagnosed based on:  A physical exam. You may be asked to cough or strain while standing. These actions increase the pressure inside your abdomen and force the hernia through the opening in your muscles. Your health care provider may try to reduce the hernia by pressing on it.  Your symptoms and medical history.  How is this treated? Surgery is the only treatment for an umbilical hernia. Surgery for a strangulated hernia is done as soon as possible. If you have a small hernia that is not incarcerated, you may need to lose weight before having surgery. Follow these instructions at home:  Lose weight, if told by your health care provider.  Do not try to push the hernia back in.  Watch your hernia for any changes in color or size. Tell your health care provider if any changes occur.  You may need to avoid activities that increase pressure on your hernia.  Do not lift anything that is heavier than 10 lb (4.5 kg) until your health care provider says that this is safe.  Take over-the-counter and  prescription medicines only as told by your health care provider.  Keep all follow-up visits as told by your health care provider. This is important. Contact a health care provider if:  Your hernia gets larger.  Your hernia becomes painful. Get help right away if:  You develop sudden, severe pain near the area of your hernia.  You have pain as well as nausea or vomiting.  You have pain and the skin over your hernia changes color.  You  develop a fever. This information is not intended to replace advice given to you by your health care provider. Make sure you discuss any questions you have with your health care provider. Document Released: 11/23/2015 Document Revised: 02/24/2016 Document Reviewed: 11/23/2015 Elsevier Interactive Patient Education  2018 [employer], Adult     A hernia happens when tissue inside your body pushes out through a weak spot in your belly muscles (abdominal wall). This makes a round lump (bulge). The lump may be:  In a scar from surgery that was done in your belly (incisional hernia).  Near your belly button (umbilical hernia).  In your groin (inguinal hernia). Your groin is the area where your leg meets your lower belly (abdomen). This kind of hernia could also be: ? In your scrotum, if you are male. ? In folds of skin around your vagina, if you are male.  In your upper thigh (femoral hernia).  Inside your belly (hiatal hernia). This happens when your stomach slides above the muscle between your belly and your chest (diaphragm). If your hernia is small and it does not cause pain, you may not need treatment. If your hernia is large or it causes pain, you may need surgery. Follow these instructions at home: Activity  Avoid stretching or overusing (straining) the muscles near your hernia. Straining can happen when you: ? Lift something heavy. ? Poop (have a bowel movement).  Do not lift anything that is heavier than 10 lb (4.5 kg), or the limit that you are told, until your doctor says that it is safe.  Use the strength of your legs when you lift something heavy. Do not use only your back muscles to lift. General instructions  Do these things if told by your doctor so you do not have trouble pooping (constipation): ? Drink enough fluid to keep your pee (urine) pale yellow. ? Eat foods that are high in fiber. These include fresh fruits and vegetables, whole grains, and  beans. ? Limit foods that are high in fat and processed sugars. These include foods that are fried or sweet. ? Take medicine for trouble pooping.  When you cough, try to cough gently.  You may try to push your hernia in by very gently pressing on it when you are lying down. Do not try to force the bulge back in if it will not push in easily.  If you are overweight, work with your doctor to lose weight safely.  Do not use any products that have nicotine or tobacco in them. These include cigarettes and e-cigarettes. If you need help quitting, ask your doctor.  If you will be having surgery (hernia repair), watch your hernia for changes in shape, size, or color. Tell your doctor if you see any changes.  Take over-the-counter and prescription medicines only as told by your doctor.  Keep all follow-up visits as told by your doctor. Contact a doctor if:  You get new pain, swelling, or redness near your hernia.  You poop fewer times in a week than normal.  You have trouble pooping.  You have poop (stool) that is more dry than normal.  You have poop that is harder or larger than normal. Get help right away if:  You have a fever.  You have belly pain that gets worse.  You feel sick to your stomach (nauseous).  You throw up (vomit).  Your hernia cannot be pushed in by very gently pressing on it when you are lying down. Do not try to force the bulge back in if it will not push in easily.  Your hernia: ? Changes in shape or size. ? Changes color. ? Feels hard or it hurts when you touch it. These symptoms may represent a serious problem that is an emergency. Do not wait to see if the symptoms will go away. Get medical help right away. Call your local emergency services (911 in the [place] Summary  A hernia happens when tissue inside your body pushes out through a weak spot in the belly muscles. This creates a bulge.  If your hernia is small and it does not hurt, you may not need  treatment. If your hernia is large or it hurts, you may need surgery.  If you will be having surgery, watch your hernia for changes in shape, size, or color. Tell your doctor about any changes. This information is not intended to replace advice given to you by your health care provider. Make sure you discuss any questions you have with your health care provider. Document Released: 12/11/2009 Document Revised: 10/14/2018 Document Reviewed: 03/25/2017 Elsevier Patient Education  2020 [employer].

## 2019-03-18 ENCOUNTER — Telehealth: Payer: Self-pay | Admitting: General Surgery

## 2019-03-18 NOTE — Telephone Encounter (Signed)
Pt advised of pre op date/time and sx date. Sx: 04/20/19 with Dr Cher Nakai umbilical hernia repair.  Pre op: 04/13/19 between 8-1:00pm-phone interview.  Covid testing date-04/15/19-[facility] between 8-10:30am.   Patient understands all surgery info given.   Emmi Video-pre surgical and Covid-sent via email.   Patient made aware to call [phone], between 1-3:00pm the day before surgery, to find out what time to arrive.

## 2019-03-23 ENCOUNTER — Other Ambulatory Visit: Payer: 59

## 2019-03-25 ENCOUNTER — Other Ambulatory Visit: Payer: 59

## 2019-04-13 ENCOUNTER — Encounter
Admission: RE | Admit: 2019-04-13 | Discharge: 2019-04-13 | Disposition: A | Discharge status: Home / Self Care | Payer: 59 | Source: Ambulatory Visit | Attending: General Surgery | Admitting: General Surgery

## 2019-04-13 ENCOUNTER — Other Ambulatory Visit: Payer: Self-pay

## 2019-04-13 HISTORY — DX: Essential (primary) hypertension: I10

## 2019-04-13 HISTORY — DX: Fatty (change of) liver, not elsewhere classified: K76.0

## 2019-04-13 NOTE — Patient Instructions (Signed)
Your procedure is scheduled on: 04/20/2019 Wed Report to Same Day Surgery 2nd floor medical mall [facility] Entrance-take elevator on left to 2nd floor.  Check in with surgery information desk.) To find out your arrival time please call [phone] between 1PM - 3PM on 04/19/2019 Tues  Remember: Instructions that are not followed completely may result in serious medical risk, up to and including death, or upon the discretion of your surgeon and anesthesiologist your surgery may need to be rescheduled.    _x___ 1. Do not eat food after midnight the night before your procedure. You may drink clear liquids up to 2 hours before you are scheduled to arrive at the hospital for your procedure.  Do not drink clear liquids within 2 hours of your scheduled arrival to the hospital.  Clear liquids include  --Water or Apple juice without pulp  --Clear carbohydrate beverage such as ClearFast or Gatorade  --Black Coffee or Clear Tea (No milk, no creamers, do not add anything to                  the coffee or Tea Type 1 and type 2 diabetics should only drink water.   ____Ensure clear carbohydrate drink on the way to the hospital for bariatric patients  ____Ensure clear carbohydrate drink 3 hours before surgery.   No gum chewing or hard candies.     __x__ 2. No Alcohol for 24 hours before or after surgery.   __x__3. No Smoking or e-cigarettes for 24 prior to surgery.  Do not use any chewable tobacco products for at least 6 hour prior to surgery   ____  4. Bring all medications with you on the day of surgery if instructed.    __x__ 5. Notify your doctor if there is any change in your medical condition     (cold, fever, infections).    x___6. On the morning of surgery brush your teeth with toothpaste and water.  You may rinse your mouth with mouth wash if you wish.  Do not swallow any toothpaste or mouthwash.   Do not wear jewelry, make-up, hairpins, clips or nail polish.  Do not wear lotions,  powders, or perfumes. You may wear deodorant.  Do not shave 48 hours prior to surgery. Men may shave face and neck.  Do not bring valuables to the hospital.    [facility] is not responsible for any belongings or valuables.               Contacts, dentures or bridgework may not be worn into surgery.  Leave your suitcase in the car. After surgery it may be brought to your room.  For patients admitted to the hospital, discharge time is determined by your                       treatment team.  _  Patients discharged the day of surgery will not be allowed to drive home.  You will need someone to drive you home and stay with you the night of your procedure.    Please read over the following fact sheets that you were given:   [facility] Preparing for Surgery and or MRSA Information   _x___ Take anti-hypertensive listed below, cardiac, seizure, asthma,     anti-reflux and psychiatric medicines. These include:  1. loratadine (CLARITIN) 10 MG tablet if needed  2.  3.  4.  5.  6.  ____Fleets enema or Magnesium Citrate as directed.  _x___ Use CHG Soap or sage wipes as directed on instruction sheet   ____ Use inhalers on the day of surgery and bring to hospital day of surgery  _x___ Stop Metformin and Janumet 2 days prior to surgery.    ____ Take 1/2 of usual insulin dose the night before surgery and none on the morning     surgery.   _x___ Follow recommendations from Cardiologist, Pulmonologist or PCP regarding          stopping Aspirin, Coumadin, Plavix ,Eliquis, Effient, or Pradaxa, and Pletal.  X____Stop Anti-inflammatories such as Advil, Aleve, Ibuprofen, Motrin, Naproxen, Naprosyn, Goodies powders or aspirin products. OK to take Tylenol and                          Celebrex.   _x___ Stop supplements until after surgery.  But may continue Vitamin D, Vitamin B,       and multivitamin.   ____ Bring C-Pap to the hospital.

## 2019-04-15 ENCOUNTER — Encounter
Admission: RE | Admit: 2019-04-15 | Discharge: 2019-04-15 | Disposition: A | Discharge status: Home / Self Care | Payer: 59 | Source: Ambulatory Visit | Attending: General Surgery | Admitting: General Surgery

## 2019-04-15 ENCOUNTER — Other Ambulatory Visit: Payer: 59

## 2019-04-15 ENCOUNTER — Other Ambulatory Visit: Payer: Self-pay

## 2019-04-15 DIAGNOSIS — Z01812 Encounter for preprocedural laboratory examination: Secondary | ICD-10-CM | POA: Diagnosis not present

## 2019-04-15 DIAGNOSIS — Z79899 Other long term (current) drug therapy: Secondary | ICD-10-CM | POA: Diagnosis not present

## 2019-04-15 DIAGNOSIS — Z20828 Contact with and (suspected) exposure to other viral communicable diseases: Secondary | ICD-10-CM | POA: Diagnosis not present

## 2019-04-15 DIAGNOSIS — K429 Umbilical hernia without obstruction or gangrene: Secondary | ICD-10-CM | POA: Diagnosis present

## 2019-04-15 DIAGNOSIS — K420 Umbilical hernia with obstruction, without gangrene: Secondary | ICD-10-CM | POA: Diagnosis not present

## 2019-04-15 DIAGNOSIS — I10 Essential (primary) hypertension: Secondary | ICD-10-CM | POA: Diagnosis not present

## 2019-04-15 DIAGNOSIS — E119 Type 2 diabetes mellitus without complications: Secondary | ICD-10-CM | POA: Diagnosis not present

## 2019-04-15 DIAGNOSIS — Z7984 Long term (current) use of oral hypoglycemic drugs: Secondary | ICD-10-CM | POA: Diagnosis not present

## 2019-04-15 LAB — CBC
HCT: 44.2 % (ref 39.0–52.0)
Hemoglobin: 14.9 g/dL (ref 13.0–17.0)
MCH: 29.7 pg (ref 26.0–34.0)
MCHC: 33.7 g/dL (ref 30.0–36.0)
MCV: 88.2 fL (ref 80.0–100.0)
Platelets: 191 10*3/uL (ref 150–400)
RBC: 5.01 MIL/uL (ref 4.22–5.81)
RDW: 13 % (ref 11.5–15.5)
WBC: 9.1 10*3/uL (ref 4.0–10.5)
nRBC: 0 % (ref 0.0–0.2)

## 2019-04-15 LAB — SARS CORONAVIRUS 2 (TAT 6-24 HRS): SARS Coronavirus 2: NEGATIVE

## 2019-04-20 ENCOUNTER — Encounter: Payer: Self-pay | Admitting: *Deleted

## 2019-04-20 ENCOUNTER — Encounter
Admission: RE | Disposition: A | Discharge status: Home / Self Care | Payer: Self-pay | Source: Home / Self Care | Attending: General Surgery

## 2019-04-20 ENCOUNTER — Ambulatory Visit: Payer: 59 | Admitting: Anesthesiology

## 2019-04-20 ENCOUNTER — Ambulatory Visit
Admission: RE | Admit: 2019-04-20 | Discharge: 2019-04-20 | Disposition: A | Discharge status: Home / Self Care | Payer: 59 | Attending: General Surgery | Admitting: General Surgery

## 2019-04-20 DIAGNOSIS — K420 Umbilical hernia with obstruction, without gangrene: Secondary | ICD-10-CM | POA: Diagnosis not present

## 2019-04-20 DIAGNOSIS — E119 Type 2 diabetes mellitus without complications: Secondary | ICD-10-CM | POA: Insufficient documentation

## 2019-04-20 DIAGNOSIS — Z79899 Other long term (current) drug therapy: Secondary | ICD-10-CM | POA: Insufficient documentation

## 2019-04-20 DIAGNOSIS — Z7984 Long term (current) use of oral hypoglycemic drugs: Secondary | ICD-10-CM | POA: Insufficient documentation

## 2019-04-20 DIAGNOSIS — I10 Essential (primary) hypertension: Secondary | ICD-10-CM | POA: Insufficient documentation

## 2019-04-20 HISTORY — PX: UMBILICAL HERNIA REPAIR: SHX196

## 2019-04-20 LAB — GLUCOSE, CAPILLARY
Glucose-Capillary: 156 mg/dL — ABNORMAL HIGH (ref 70–99)
Glucose-Capillary: 144 mg/dL — ABNORMAL HIGH (ref 70–99)

## 2019-04-20 SURGERY — REPAIR, HERNIA, UMBILICAL, ADULT
Anesthesia: General | Site: Abdomen

## 2019-04-20 MED ORDER — ACETAMINOPHEN 500 MG PO TABS
ORAL_TABLET | ORAL | Status: AC
  Administered 2019-04-20: 1000 mg via ORAL
  Filled 2019-04-20: qty 2

## 2019-04-20 MED ORDER — FAMOTIDINE 20 MG PO TABS
20.0000 mg | ORAL_TABLET | Freq: Once | ORAL | Status: AC
  Administered 2019-04-20: 09:00:00 20 mg via ORAL

## 2019-04-20 MED ORDER — ROCURONIUM BROMIDE 100 MG/10ML IV SOLN
INTRAVENOUS | Status: DC | PRN
  Administered 2019-04-20: 50 mg via INTRAVENOUS

## 2019-04-20 MED ORDER — LIDOCAINE-EPINEPHRINE 1 %-1:100000 IJ SOLN
INTRAMUSCULAR | Status: DC | PRN
  Administered 2019-04-20: 5 mL

## 2019-04-20 MED ORDER — HYDROCODONE-ACETAMINOPHEN 5-325 MG PO TABS: 1.0000 | ORAL_TABLET | Freq: Four times a day (QID) | ORAL | 0 refills | Status: DC | PRN

## 2019-04-20 MED ORDER — ONDANSETRON HCL 4 MG/2ML IJ SOLN
INTRAMUSCULAR | Status: AC
  Filled 2019-04-20: qty 2

## 2019-04-20 MED ORDER — SUGAMMADEX SODIUM 200 MG/2ML IV SOLN
INTRAVENOUS | Status: DC | PRN
  Administered 2019-04-20: 199.4 mg via INTRAVENOUS

## 2019-04-20 MED ORDER — OXYCODONE HCL 5 MG PO TABS: 5.0000 mg | ORAL_TABLET | Freq: Once | ORAL | Status: DC | PRN

## 2019-04-20 MED ORDER — MIDAZOLAM HCL 2 MG/2ML IJ SOLN
INTRAMUSCULAR | Status: DC | PRN
  Administered 2019-04-20: 2 mg via INTRAVENOUS

## 2019-04-20 MED ORDER — BUPIVACAINE LIPOSOME 1.3 % IJ SUSP
INTRAMUSCULAR | Status: AC
  Filled 2019-04-20: qty 20

## 2019-04-20 MED ORDER — FENTANYL CITRATE (PF) 250 MCG/5ML IJ SOLN
INTRAMUSCULAR | Status: AC
  Filled 2019-04-20: qty 5

## 2019-04-20 MED ORDER — BUPIVACAINE LIPOSOME 1.3 % IJ SUSP
INTRAMUSCULAR | Status: DC | PRN
  Administered 2019-04-20: 20 mL

## 2019-04-20 MED ORDER — BACITRACIN-NEOMYCIN-POLYMYXIN 400-5-5000 EX OINT
TOPICAL_OINTMENT | CUTANEOUS | Status: DC | PRN
  Administered 2019-04-20: 1 via TOPICAL

## 2019-04-20 MED ORDER — CHLORHEXIDINE GLUCONATE CLOTH 2 % EX PADS: 6.0000 | MEDICATED_PAD | Freq: Once | CUTANEOUS | Status: DC

## 2019-04-20 MED ORDER — MEPERIDINE HCL 50 MG/ML IJ SOLN: 6.2500 mg | INTRAMUSCULAR | Status: DC | PRN

## 2019-04-20 MED ORDER — GABAPENTIN 300 MG PO CAPS
ORAL_CAPSULE | ORAL | Status: AC
  Administered 2019-04-20: 300 mg via ORAL
  Filled 2019-04-20: qty 1

## 2019-04-20 MED ORDER — BUPIVACAINE HCL (PF) 0.25 % IJ SOLN
INTRAMUSCULAR | Status: AC
  Filled 2019-04-20: qty 30

## 2019-04-20 MED ORDER — PROPOFOL 10 MG/ML IV BOLUS
INTRAVENOUS | Status: AC
  Filled 2019-04-20: qty 20

## 2019-04-20 MED ORDER — PROPOFOL 10 MG/ML IV BOLUS
INTRAVENOUS | Status: DC | PRN
  Administered 2019-04-20: 200 mg via INTRAVENOUS

## 2019-04-20 MED ORDER — IBUPROFEN 800 MG PO TABS: 800.0000 mg | ORAL_TABLET | Freq: Three times a day (TID) | ORAL | 0 refills | Status: DC | PRN

## 2019-04-20 MED ORDER — CEFAZOLIN SODIUM-DEXTROSE 2-4 GM/100ML-% IV SOLN
INTRAVENOUS | Status: AC
  Filled 2019-04-20: qty 100

## 2019-04-20 MED ORDER — BUPIVACAINE LIPOSOME 1.3 % IJ SUSP: 20.0000 mL | Freq: Once | INTRAMUSCULAR | Status: DC

## 2019-04-20 MED ORDER — ACETAMINOPHEN 500 MG PO TABS
1000.0000 mg | ORAL_TABLET | ORAL | Status: AC
  Administered 2019-04-20: 09:00:00 1000 mg via ORAL

## 2019-04-20 MED ORDER — GABAPENTIN 300 MG PO CAPS
300.0000 mg | ORAL_CAPSULE | ORAL | Status: AC
  Administered 2019-04-20: 09:00:00 300 mg via ORAL

## 2019-04-20 MED ORDER — ONDANSETRON HCL 4 MG/2ML IJ SOLN
INTRAMUSCULAR | Status: DC | PRN
  Administered 2019-04-20: 4 mg via INTRAVENOUS

## 2019-04-20 MED ORDER — FAMOTIDINE 20 MG PO TABS
ORAL_TABLET | ORAL | Status: AC
  Administered 2019-04-20: 20 mg via ORAL
  Filled 2019-04-20: qty 1

## 2019-04-20 MED ORDER — FENTANYL CITRATE (PF) 100 MCG/2ML IJ SOLN
INTRAMUSCULAR | Status: DC | PRN
  Administered 2019-04-20 (×3): 50 ug via INTRAVENOUS

## 2019-04-20 MED ORDER — CEFAZOLIN SODIUM-DEXTROSE 2-4 GM/100ML-% IV SOLN
2.0000 g | INTRAVENOUS | Status: AC
  Administered 2019-04-20: 2 g via INTRAVENOUS

## 2019-04-20 MED ORDER — MIDAZOLAM HCL 2 MG/2ML IJ SOLN
INTRAMUSCULAR | Status: AC
  Filled 2019-04-20: qty 2

## 2019-04-20 MED ORDER — LIDOCAINE HCL (CARDIAC) PF 100 MG/5ML IV SOSY
PREFILLED_SYRINGE | INTRAVENOUS | Status: DC | PRN
  Administered 2019-04-20: 100 mg via INTRAVENOUS

## 2019-04-20 MED ORDER — KETOROLAC TROMETHAMINE 30 MG/ML IJ SOLN
INTRAMUSCULAR | Status: DC | PRN
  Administered 2019-04-20: 30 mg via INTRAVENOUS

## 2019-04-20 MED ORDER — LIDOCAINE-EPINEPHRINE 1 %-1:100000 IJ SOLN
INTRAMUSCULAR | Status: AC
  Filled 2019-04-20: qty 1

## 2019-04-20 MED ORDER — PROMETHAZINE HCL 25 MG/ML IJ SOLN: 6.2500 mg | INTRAMUSCULAR | Status: DC | PRN

## 2019-04-20 MED ORDER — SODIUM CHLORIDE 0.9 % IV SOLN
INTRAVENOUS | Status: DC
  Administered 2019-04-20: 09:00:00 via INTRAVENOUS

## 2019-04-20 MED ORDER — BACITRACIN-NEOMYCIN-POLYMYXIN 400-5-5000 EX OINT
TOPICAL_OINTMENT | CUTANEOUS | Status: AC
  Filled 2019-04-20: qty 1

## 2019-04-20 MED ORDER — OXYCODONE HCL 5 MG/5ML PO SOLN: 5.0000 mg | Freq: Once | ORAL | Status: DC | PRN

## 2019-04-20 MED ORDER — DEXMEDETOMIDINE HCL IN NACL 200 MCG/50ML IV SOLN
INTRAVENOUS | Status: DC | PRN
  Administered 2019-04-20: 8 ug via INTRAVENOUS

## 2019-04-20 MED ORDER — FENTANYL CITRATE (PF) 100 MCG/2ML IJ SOLN: 25.0000 ug | INTRAMUSCULAR | Status: DC | PRN

## 2019-04-20 MED ORDER — BUPIVACAINE HCL (PF) 0.25 % IJ SOLN
INTRAMUSCULAR | Status: DC | PRN
  Administered 2019-04-20: 5 mL

## 2019-04-20 SURGICAL SUPPLY — 33 items
ADH SKN CLS APL DERMABOND .7 (GAUZE/BANDAGES/DRESSINGS) ×1
APL PRP STRL LF DISP 70% ISPRP (MISCELLANEOUS) ×1
BLADE SURG 15 STRL LF DISP TIS (BLADE) ×1 IMPLANT
BLADE SURG 15 STRL SS (BLADE) ×2
CANISTER SUCT 1200ML W/VALVE (MISCELLANEOUS) ×2 IMPLANT
CHLORAPREP W/TINT 26 (MISCELLANEOUS) ×2 IMPLANT
COVER WAND RF STERILE (DRAPES) ×1 IMPLANT
DERMABOND ADVANCED (GAUZE/BANDAGES/DRESSINGS) ×1
DERMABOND ADVANCED .7 DNX12 (GAUZE/BANDAGES/DRESSINGS) ×1 IMPLANT
DRAPE LAPAROTOMY 77X122 PED (DRAPES) ×2 IMPLANT
ELECT CAUTERY BLADE TIP 2.5 (TIP) ×2
ELECT REM PT RETURN 9FT ADLT (ELECTROSURGICAL) ×2
ELECTRODE CAUTERY BLDE TIP 2.5 (TIP) ×1 IMPLANT
ELECTRODE REM PT RTRN 9FT ADLT (ELECTROSURGICAL) ×1 IMPLANT
GLOVE BIO SURGEON STRL SZ 6.5 (GLOVE) ×2 IMPLANT
GLOVE INDICATOR 7.0 STRL GRN (GLOVE) ×4 IMPLANT
GOWN STRL REUS W/ TWL LRG LVL3 (GOWN DISPOSABLE) ×2 IMPLANT
GOWN STRL REUS W/TWL LRG LVL3 (GOWN DISPOSABLE) ×4
KIT TURNOVER KIT A (KITS) ×2 IMPLANT
LABEL OR SOLS (LABEL) ×2 IMPLANT
NEEDLE HYPO 22GX1.5 SAFETY (NEEDLE) ×2 IMPLANT
NS IRRIG 500ML POUR BTL (IV SOLUTION) ×2 IMPLANT
PACK BASIN MINOR ARMC (MISCELLANEOUS) ×2 IMPLANT
SPONGE VERSALON 4X4 4PLY (MISCELLANEOUS) ×1 IMPLANT
STRIP CLOSURE SKIN 1/2X4 (GAUZE/BANDAGES/DRESSINGS) ×2 IMPLANT
SUT ETHIBOND NAB MO 7 #0 18IN (SUTURE) ×1 IMPLANT
SUT MNCRL 4-0 (SUTURE) ×2
SUT MNCRL 4-0 27XMFL (SUTURE) ×1
SUT VIC AB 3-0 SH 27 (SUTURE) ×2
SUT VIC AB 3-0 SH 27X BRD (SUTURE) ×1 IMPLANT
SUTURE MNCRL 4-0 27XMF (SUTURE) ×1 IMPLANT
SYR 10ML LL (SYRINGE) ×2 IMPLANT
SYR BULB IRRIG 60ML STRL (SYRINGE) ×2 IMPLANT

## 2019-04-20 NOTE — Anesthesia Post-op Follow-up Note (Signed)
Anesthesia QCDR form completed.        

## 2019-04-20 NOTE — H&P (Signed)
HPI Bryan Doyle is a 48 y.o. male.   He was referred by his primary care provider, Dr. Damita Dunnings, for surgical evaluation of an umbilical hernia.  He states that it is been present for many years.  It does not bother him.  It is not painful.  He has never tried to reduce it, so he is not sure if it is possible.  He has never had any nausea, vomiting, or difficulty with bowel movements that he can relate to a hernia.  He states that it bulges, and that is about it.       Past Medical History:  Diagnosis Date  . Allergy   . Asthma    exercise induced - as child. no current issues  . Diabetes mellitus   . Gout   . Headache    sinus  . Hyperlipidemia          Past Surgical History:  Procedure Laterality Date  . ETHMOIDECTOMY Right 05/16/2015   Procedure: ETHMOIDECTOMY- TOTAL;  Surgeon: Carloyn Manner, MD;  Location: [place];  Service: ENT;  Laterality: Right;  . HERNIA REPAIR     bilateral   . IMAGE GUIDED SINUS SURGERY N/A 05/16/2015   Procedure: IMAGE GUIDED SINUS SURGERY;  Surgeon: Carloyn Manner, MD;  Location: [place];  Service: ENT;  Laterality: N/A;  GAVE DISK TO [facility] Diabetic - oral meds  . MAXILLARY ANTROSTOMY Bilateral 05/16/2015   Procedure: MAXILLARY ANTROSTOMY;  Surgeon: Carloyn Manner, MD;  Location: [place];  Service: ENT;  Laterality: Bilateral;  . SEPTOPLASTY N/A 05/16/2015   Procedure: SEPTOPLASTY;  Surgeon: Carloyn Manner, MD;  Location: [place];  Service: ENT;  Laterality: N/A;  . TURBINATE REDUCTION Bilateral 05/16/2015   Procedure: INFERIOR TURBINATE REDUCTION;  Surgeon: Carloyn Manner, MD;  Location: [place];  Service: ENT;  Laterality: Bilateral;  . VASECTOMY  2013   Dr. Jacqlyn Larsen         Family History  Problem Relation Age of Onset  . COPD Mother   . Cancer Mother        breast  . Diabetes Father        type 1  . Hyperlipidemia Father   . Hypertension Father    . Cancer Paternal Aunt   . Diabetes Maternal Grandfather   . Prostate cancer Maternal Grandfather   . Colon cancer Neg Hx     Social History Social History        Tobacco Use  . Smoking status: Never Smoker  . Smokeless tobacco: Never Used  Substance Use Topics  . Alcohol use: Yes    Alcohol/week: 0.0 standard drinks    Comment: rarely  . Drug use: No         Allergies  Allergen Reactions  . Penicillins     REACTION: u/k- was told in infancy to avoid  . Sulfa Antibiotics Rash    Severe rash and fever          Current Outpatient Medications  Medication Sig Dispense Refill  . Ascorbic Acid (VITAMIN C) 500 MG tablet Take 500 mg by mouth daily.      Marland Kitchen FREESTYLE LITE test strip TEST BLOOD SUGAR ONCE A DAY AND AS DIRECTED 100 each 3  . indomethacin (INDOCIN) 50 MG capsule Take 1 capsule (50 mg total) by mouth 3 (three) times daily as needed (with food).    . Lancets (FREESTYLE) lancets Check blood sugar once daily and as directed. Dx E11.9 100 each  3  . lisinopril (PRINIVIL,ZESTRIL) 2.5 MG tablet Take 1 tablet (2.5 mg total) by mouth daily. 90 tablet 3  . metFORMIN (GLUCOPHAGE) 500 MG tablet TAKE 1 TABLET DAILY WITH BREAKFAST 90 tablet 2  . pravastatin (PRAVACHOL) 10 MG tablet TAKE 1 TABLET DAILY 90 tablet 3   No current facility-administered medications for this visit.     Review of Systems Review of Systems  All other systems reviewed and are negative.   Today's Vitals   04/20/19 0855  BP: (!) 153/91  Pulse: 72  Resp: 16  Temp: (!) 96.5 F (35.8 C)  TempSrc: Tympanic  SpO2: 99%  Weight: 99.7 kg  Height: 5' 7.5" (1.715 m)  PainSc: 0-No pain   Body mass index is 33.92 kg/m.   Physical Exam Physical Exam Constitutional:      General: He is not in acute distress.    Appearance: He is obese.  HENT:     Head: Normocephalic and atraumatic.     Nose:     Comments: Covered with a mask, secondary to COVID-19 precautions     Mouth/Throat:     Comments: Covered with a mask, secondary to COVID-19 precautions Eyes:     General: No scleral icterus.       Right eye: No discharge.        Left eye: No discharge.     Conjunctiva/sclera: Conjunctivae normal.  Neck:     Musculoskeletal: Normal range of motion. No neck rigidity.     Comments: No thyromegaly or dominant thyroid masses appreciated. Cardiovascular:     Rate and Rhythm: Normal rate and regular rhythm.     Pulses: Normal pulses.     Heart sounds: No murmur.  Pulmonary:     Effort: Pulmonary effort is normal. No respiratory distress.     Breath sounds: Normal breath sounds.  Abdominal:     General: Bowel sounds are normal.     Palpations: Abdomen is soft.     Hernia: A hernia is present. Hernia is present in the umbilical area.       Comments: Protuberant, consistent with his level of obesity.  There is a visible umbilical hernia present.  The overlying skin is slightly reddened and thinned.  On palpation, it feels to have omentum within it.  I attempted to reduce it in clinic.  I was able to get part of the hernia reduced, but it did not completely reduce.  The hernia defect seems small, however I was not able to palpate the fascial edges.  Genitourinary:    Comments: Deferred Musculoskeletal: Normal range of motion.        General: No swelling or tenderness.  Lymphadenopathy:     Cervical: No cervical adenopathy.  Skin:    General: Skin is warm and dry.  Neurological:     General: No focal deficit present.     Mental Status: He is alert and oriented to person, place, and time.  Psychiatric:        Mood and Affect: Mood normal.        Behavior: Behavior normal.     Data Reviewed I reviewed Dr. Josefine Class note from the clinic visit of 28 February 2019.  No specific details relevant to this visit, aside from the referral to surgery for the umbilical hernia.  I have reviewed his labs, specifically his hemoglobin A1c values for the past year.  These  have ranged from 6.9 to the most recent value of 8.5, suggesting inadequate glucose control.  Assessment This is a 48 year old male with a longstanding umbilical hernia.  Although it does not bother him, it is incarcerated.  I recommended that he undergo surgical management.  Plan I discussed the risks of the operation with him.  These include, but are not limited to, leading, infection, injury to bowel, need to resect bowel, need to use mesh, mesh complications, pain, and hernia recurrence.  He had the opportunity to ask any questions and these were all answered to his satisfaction.  We will schedule him for surgery at a mutually agreeable date and time.   No changes to medical or surgical history. We will proceed with the operation as above.

## 2019-04-20 NOTE — Anesthesia Procedure Notes (Signed)
Procedure Name: Intubation Date/Time: 04/20/2019 11:05 AM Performed by: Justus Memory, CRNA Pre-anesthesia Checklist: Patient identified, Patient being monitored, Timeout performed, Emergency Drugs available and Suction available Patient Re-evaluated:Patient Re-evaluated prior to induction Oxygen Delivery Method: Circle system utilized Preoxygenation: Pre-oxygenation with 100% oxygen Induction Type: IV induction Ventilation: Mask ventilation without difficulty Laryngoscope Size: Mac and 3 Grade View: Grade I Tube type: Oral Tube size: 7.0 mm Number of attempts: 1 Airway Equipment and Method: Stylet Placement Confirmation: ETT inserted through vocal cords under direct vision,  positive ETCO2 and breath sounds checked- equal and bilateral Secured at: 21 cm Tube secured with: Tape Dental Injury: Teeth and Oropharynx as per pre-operative assessment

## 2019-04-20 NOTE — Anesthesia Postprocedure Evaluation (Signed)
Anesthesia Post Note  Patient: ALYIS CORT  Procedure(s) Performed: HERNIA REPAIR UMBILICAL ADULT, OPEN (N/A Abdomen)  Patient location during evaluation: PACU Anesthesia Type: General Level of consciousness: awake and alert and oriented Pain management: pain level controlled Vital Signs Assessment: post-procedure vital signs reviewed and stable Respiratory status: spontaneous breathing, nonlabored ventilation and respiratory function stable Cardiovascular status: blood pressure returned to baseline and stable Postop Assessment: no signs of nausea or vomiting Anesthetic complications: no     Last Vitals:  Vitals:   04/20/19 1210 04/20/19 1221  BP: 131/80 (!) 144/81  Pulse: 65 (!) 52  Resp: 10 14  Temp: (!) 36.3 C (!) 36.1 C  SpO2: 97% 96%    Last Pain:  Vitals:   04/20/19 1221  TempSrc: Temporal  PainSc: 0-No pain                 Kelleen Stolze

## 2019-04-20 NOTE — Transfer of Care (Signed)
Immediate Anesthesia Transfer of Care Note  Patient: Bryan Doyle  Procedure(s) Performed: HERNIA REPAIR UMBILICAL ADULT, OPEN (N/A Abdomen)  Patient Location: PACU  Anesthesia Type:General  Level of Consciousness: sedated  Airway & Oxygen Therapy: Patient Spontanous Breathing and Patient connected to face mask oxygen  Post-op Assessment: Report given to RN  Post vital signs: Reviewed and stable  Last Vitals:  Vitals Value Taken Time  BP    Temp    Pulse 72 04/20/19 1130  Resp 15 04/20/19 1130  SpO2 98 % 04/20/19 1130  Vitals shown include unvalidated device data.  Last Pain:  Vitals:   04/20/19 0855  TempSrc: Tympanic  PainSc: 0-No pain         Complications: No apparent anesthesia complications

## 2019-04-20 NOTE — Discharge Instructions (Signed)
AMBULATORY SURGERY  DISCHARGE INSTRUCTIONS   1) The drugs that you were given will stay in your system until tomorrow so for the next 24 hours you should not:  A) Drive an automobile B) Make any legal decisions C) Drink any alcoholic beverage   2) You may resume regular meals tomorrow.  Today it is better to start with liquids and gradually work up to solid foods.  You may eat anything you prefer, but it is better to start with liquids, then soup and crackers, and gradually work up to solid foods.   3) Please notify your doctor immediately if you have any unusual bleeding, trouble breathing, redness and pain at the surgery site, drainage, fever, or pain not relieved by medication.    4) Additional Instructions:        Please contact your physician with any problems or Same Day Surgery at [phone], Monday through Friday 6 am to 4 pm, or [place] at [facility] number at [phone].AMBULATORY SURGERY  DISCHARGE INSTRUCTIONS   5) The drugs that you were given will stay in your system until tomorrow so for the next 24 hours you should not:  D) Drive an automobile E) Make any legal decisions F) Drink any alcoholic beverage   6) You may resume regular meals tomorrow.  Today it is better to start with liquids and gradually work up to solid foods.  You may eat anything you prefer, but it is better to start with liquids, then soup and crackers, and gradually work up to solid foods.   7) Please notify your doctor immediately if you have any unusual bleeding, trouble breathing, redness and pain at the surgery site, drainage, fever, or pain not relieved by medication.    8) Additional Instructions:        Please contact your physician with any problems or Same Day Surgery at [phone], Monday through Friday 6 am to 4 pm, or [place] at [facility] number at [phone].Open Hernia Repair, Adult, Care After This sheet gives you information about  how to care for yourself after your procedure. Your health care provider may also give you more specific instructions. If you have problems or questions, contact your health care provider. What can I expect after the procedure? After the procedure, it is common to have:  Mild discomfort.  Slight bruising.  Minor swelling.  Pain in the abdomen. Follow these instructions at home: Incision care   Follow instructions from your health care provider about how to take care of your incision area. Make sure you: ? Wash your hands with soap and water before you change your bandage (dressing). If soap and water are not available, use hand sanitizer. ? Change your dressing as told by your health care provider. ? Leave stitches (sutures), skin glue, or adhesive strips in place. These skin closures may need to stay in place for 2 weeks or longer. If adhesive strip edges start to loosen and curl up, you may trim the loose edges. Do not remove adhesive strips completely unless your health care provider tells you to do that.  Check your incision area every day for signs of infection. Check for: ? More redness, swelling, or pain. ? More fluid or blood. ? Warmth. ? Pus or a bad smell. Activity  Do not drive or use heavy machinery while taking prescription pain medicine. Do not drive until your health care provider approves.  Until your health care provider approves: ? Do not lift anything that  is heavier than 10 lb (4.5 kg). ? Do not play contact sports.  Return to your normal activities as told by your health care provider. Ask your health care provider what activities are safe. General instructions  To prevent or treat constipation while you are taking prescription pain medicine, your health care provider may recommend that you: ? Drink enough fluid to keep your urine clear or pale yellow. ? Take over-the-counter or prescription medicines. ? Eat foods that are high in fiber, such as fresh  fruits and vegetables, whole grains, and beans. ? Limit foods that are high in fat and processed sugars, such as fried and sweet foods.  Take over-the-counter and prescription medicines only as told by your health care provider.  Do not take tub baths or go swimming until your health care provider approves.  Keep all follow-up visits as told by your health care provider. This is important. Contact a health care provider if:  You develop a rash.  You have more redness, swelling, or pain around your incision.  You have more fluid or blood coming from your incision.  Your incision feels warm to the touch.  You have pus or a bad smell coming from your incision.  You have a fever or chills.  You have blood in your stool (feces).  You have not had a bowel movement in 2-3 days.  Your pain is not controlled with medicine. Get help right away if:  You have chest pain or shortness of breath.  You feel light-headed or feel faint.  You have severe pain.  You vomit and your pain is worse. This information is not intended to replace advice given to you by your health care provider. Make sure you discuss any questions you have with your health care provider. Document Released: 01/10/2005 Document Revised: 06/05/2017 Document Reviewed: 12/05/2015 Elsevier Patient Education  2020 [employer].

## 2019-04-20 NOTE — Op Note (Signed)
Operative Note  Pre-operative Diagnosis: umbilical hernia, incarcerated  Post-operative Diagnosis: same  Operation: umbilical hernia repair without mesh  Surgeon: Fredirick Maudlin, MD  Anesthesia: GETA  Assistant: None  Findings: Incarcerated omentum with a small hernia defect  Estimated Blood Loss: Minimal         Drains: None         Specimens: None          Complications: None immediately apparent         Condition: stable  Procedure Details  The patient was identified in the preoperative holding area. The benefits, complications, treatment options, and expected outcomes were discussed with the patient. The risks of bleeding, infection, recurrence of symptoms, failure to resolve symptoms, bowel injury, any of which could require further surgery were reviewed with the patient. The patient agreed to accept these risks. The patient was then taken to the operating room, identified as Bryan Doyle and the procedure verified.  A time out was performed and the above information confirmed.  Prior to the induction of general anesthesia, antibiotic prophylaxis was administered. VTE prophylaxis was in place. General endotracheal anesthesia was then administered and tolerated well. After induction, the abdomen was prepped with Chloraprep and draped in standard sterile fashion. The patient was positioned in the supine position.  The skin surrounding the planned incision site was infiltrated with a one-to-one mixture of 0.25% bupivacaine mixed with 1% lidocaine with epinephrine.  A supraumbilical incision was created over the hernia sac and electrocautery was used to dissect through subcutaneous tissue. The hernia was dissected free from adjacent tissue and fascia. The hernia sac was entered and the sac was excised. Care was taken to avoid any injury to the bowel.  The defect was small, such that it did not require mesh.  The fascial defect was closed with interrupted 0 Ethibond stitches.   Undiluted liposomal bupivacaine was injected along the fascial planes.  The cutaneous tissue was closed with 3-0 Vicryl and skin was closed with a subcuticular 4-0 Monocryl in a subcuticular fashion. Dermabond was applied to the skin and covered with Steri-Strips.  A pressure dressing was applied to the umbilicus.  The patient was then awakened, extubated, and taken to the postanesthesia care unit in good condition.  Patient tolerated procedure well and there were no immediate complications identified. Needle, instrument, and sponge counts were reported to be correct by the nursing staff.  Fredirick Maudlin, MD FACS

## 2019-04-20 NOTE — Anesthesia Preprocedure Evaluation (Signed)
Anesthesia Evaluation  Patient identified by MRN, date of birth, ID band Patient awake    Reviewed: Allergy & Precautions, NPO status , Patient's Chart, lab work & pertinent test results  History of Anesthesia Complications Negative for: history of anesthetic complications  Airway Mallampati: II  TM Distance: >3 FB Neck ROM: Full    Dental no notable dental hx.    Pulmonary asthma , neg sleep apnea,    breath sounds clear to auscultation- rhonchi (-) wheezing      Cardiovascular hypertension, Pt. on medications (-) CAD, (-) Past MI, (-) Cardiac Stents and (-) CABG  Rhythm:Regular Rate:Normal - Systolic murmurs and - Diastolic murmurs    Neuro/Psych  Headaches, neg Seizures negative psych ROS   GI/Hepatic negative GI ROS, Neg liver ROS,   Endo/Other  diabetes, Oral Hypoglycemic Agents  Renal/GU negative Renal ROS     Musculoskeletal negative musculoskeletal ROS (+)   Abdominal (+) + obese,   Peds  Hematology negative hematology ROS (+)   Anesthesia Other Findings Past Medical History: No date: Allergy No date: Asthma     Comment:  exercise induced - as child. no current issues No date: Diabetes mellitus No date: Fatty liver No date: Gout No date: Headache     Comment:  sinus No date: Hyperlipidemia No date: Hypertension   Reproductive/Obstetrics                             Anesthesia Physical Anesthesia Plan  ASA: II  Anesthesia Plan: General   Post-op Pain Management:    Induction: Intravenous  PONV Risk Score and Plan: 1 and Dexamethasone and Ondansetron  Airway Management Planned: Oral ETT  Additional Equipment:   Intra-op Plan:   Post-operative Plan: Extubation in OR  Informed Consent: I have reviewed the patients History and Physical, chart, labs and discussed the procedure including the risks, benefits and alternatives for the proposed anesthesia with the  patient or authorized representative who has indicated his/her understanding and acceptance.     Dental advisory given  Plan Discussed with: CRNA and Anesthesiologist  Anesthesia Plan Comments:         Anesthesia Quick Evaluation

## 2019-05-10 ENCOUNTER — Ambulatory Visit (INDEPENDENT_AMBULATORY_CARE_PROVIDER_SITE_OTHER): Payer: Self-pay | Admitting: General Surgery

## 2019-05-10 ENCOUNTER — Other Ambulatory Visit: Payer: Self-pay

## 2019-05-10 ENCOUNTER — Encounter: Payer: Self-pay | Admitting: General Surgery

## 2019-05-10 VITALS — BP 122/80 | HR 59 | Temp 93.2°F | Ht 67.5 in | Wt 223.8 lb

## 2019-05-10 DIAGNOSIS — Z09 Encounter for follow-up examination after completed treatment for conditions other than malignant neoplasm: Secondary | ICD-10-CM

## 2019-05-10 NOTE — Patient Instructions (Addendum)
Patient needs to refrain from any heavy lifting of no more than 10 lbs for three more weeks.   Follow-up with our office as needed.  Please call and ask to speak with a nurse if you develop questions or concerns.

## 2019-05-10 NOTE — Progress Notes (Signed)
Bryan Doyle is here today for a postoperative visit.  He underwent an uncomplicated repair of an umbilical hernia on 20 April 2019.  Due to the small fascial defect, no mesh was used and the fascia was repaired primarily with 0 Ethibond suture.  He states that he has done well since his operation.  He had no significant pain, only discomfort, which abated about 10 days postop.  He denies any fevers or chills.  No nausea or vomiting.  Appetite is normal, as are bowel movements.  Today's Vitals   05/10/19 1344  BP: 122/80  Pulse: (!) 59  Temp: (!) 93.2 F (34 C)  TempSrc: Temporal  SpO2: 96%  Weight: 223 lb 12.8 oz (101.5 kg)  Height: 5' 7.5" (1.715 m)  PainSc: 0-No pain   Body mass index is 34.53 kg/m. Focused abdominal exam: The supraumbilical site is well approximated.  There is no erythema induration or drainage.  There is an expected amount of postoperative swelling within the umbilicus itself.  With Valsalva, there is no suggestion of hernia recurrence.  Impression and plan this is a 48 year old man who had an incarcerated umbilical hernia.  He underwent surgical repair and is doing well.  He may resume all of his usual activities, with the caveat that he should continue to refrain from lifting anything heavier than 10 pounds for another 3 weeks (a total of 6 weeks from surgery.  We will see him on an as-needed basis.

## 2019-05-11 ENCOUNTER — Other Ambulatory Visit: Payer: Self-pay | Admitting: Family Medicine

## 2019-06-08 ENCOUNTER — Other Ambulatory Visit (INDEPENDENT_AMBULATORY_CARE_PROVIDER_SITE_OTHER): Payer: 59

## 2019-06-08 ENCOUNTER — Other Ambulatory Visit: Payer: Self-pay

## 2019-06-08 DIAGNOSIS — E119 Type 2 diabetes mellitus without complications: Secondary | ICD-10-CM | POA: Diagnosis not present

## 2019-06-08 LAB — COMPREHENSIVE METABOLIC PANEL
ALT: 64 U/L — ABNORMAL HIGH (ref 0–53)
AST: 38 U/L — ABNORMAL HIGH (ref 0–37)
Albumin: 3.9 g/dL (ref 3.5–5.2)
Alkaline Phosphatase: 98 U/L (ref 39–117)
BUN: 10 mg/dL (ref 6–23)
CO2: 31 mEq/L (ref 19–32)
Calcium: 9.1 mg/dL (ref 8.4–10.5)
Chloride: 102 mEq/L (ref 96–112)
Creatinine, Ser: 0.96 mg/dL (ref 0.40–1.50)
GFR: 83.68 mL/min (ref >60.00)
Glucose, Bld: 186 mg/dL — ABNORMAL HIGH (ref 70–99)
Potassium: 4.2 mEq/L (ref 3.5–5.1)
Sodium: 139 mEq/L (ref 135–145)
Total Bilirubin: 0.8 mg/dL (ref 0.2–1.2)
Total Protein: 6.1 g/dL (ref 6.0–8.3)

## 2019-06-08 LAB — LIPID PANEL
Cholesterol: 124 mg/dL (ref 0–200)
HDL: 25.3 mg/dL — ABNORMAL LOW (ref >39.00)
LDL Cholesterol: 74 mg/dL (ref 0–99)
NonHDL: 99.08
Total CHOL/HDL Ratio: 5
Triglycerides: 125 mg/dL (ref 0.0–149.0)
VLDL: 25 mg/dL (ref 0.0–40.0)

## 2019-06-08 LAB — HEMOGLOBIN A1C: Hgb A1c MFr Bld: 8.3 % — ABNORMAL HIGH (ref 4.6–6.5)

## 2019-06-13 ENCOUNTER — Encounter: Payer: Self-pay | Admitting: Family Medicine

## 2019-06-13 ENCOUNTER — Ambulatory Visit (INDEPENDENT_AMBULATORY_CARE_PROVIDER_SITE_OTHER): Payer: 59 | Admitting: Family Medicine

## 2019-06-13 ENCOUNTER — Other Ambulatory Visit: Payer: Self-pay

## 2019-06-13 DIAGNOSIS — R21 Rash and other nonspecific skin eruption: Secondary | ICD-10-CM | POA: Diagnosis not present

## 2019-06-13 DIAGNOSIS — E119 Type 2 diabetes mellitus without complications: Secondary | ICD-10-CM

## 2019-06-13 MED ORDER — METFORMIN HCL 500 MG PO TABS: 500.0000 mg | ORAL_TABLET | Freq: Two times a day (BID) | ORAL | Status: DC

## 2019-06-13 MED ORDER — TRIAMCINOLONE ACETONIDE 0.1 % EX CREA: 1.0000 "application " | TOPICAL_CREAM | Freq: Two times a day (BID) | CUTANEOUS | 1 refills | Status: DC | PRN

## 2019-06-13 NOTE — Progress Notes (Signed)
This visit occurred during the SARS-CoV-2 public health emergency.  Safety protocols were in place, including screening questions prior to the visit, additional usage of staff PPE, and extensive cleaning of exam room while observing appropriate contact time as indicated for disinfecting solutions.   Diabetes:  Using medications without difficulties:yes Hypoglycemic episodes: no Hyperglycemic episodes: no Feet problems: no Blood Sugars averaging: 120-145s in the AMs.  eye exam within last year: f/u pending.  Labs d/w pt.  A1c slightly lower but not at goal.  On statin, lipids d/w pt.  Diet and exercise d/w pt.  "I was doing okay until the last few weeks."  He has extra work stressors recently noted.   He cut out soda but then restarted.  He is still taking less than his prev baseline.    He had his hernia repair done.  No pain now.    Rash in the R shin, use OTC hydrocortisone.  Prev itched, better with hydrocortisone but not resolved. Present for months.    Meds, vitals, and allergies reviewed.   ROS: Per HPI unless specifically indicated in ROS section   GEN: nad, alert and oriented HEENT: ncat NECK: supple w/o LA CV: rrr. PULM: ctab, no inc wob ABD: soft, +bs EXT: no edema SKIN: no acute rash, healed scar at umbilicus.  Blanching faint rash on the L medial shin.  No ulceration.

## 2019-06-13 NOTE — Assessment & Plan Note (Signed)
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  Start TAC and use prn.  Update me as needed.  He agrees.

## 2019-06-13 NOTE — Patient Instructions (Addendum)
Thanks for getting a flu shot.  Thanks for your effort.  Recheck A1c at a visit in about 3 months.   Increase metformin to 1 tab twice a day.  Update me as needed.  Take care.  Glad to see you.  Use TAC cream as needed.

## 2019-06-13 NOTE — Assessment & Plan Note (Signed)
He cut out soda but then restarted.  He is still taking less than his prev baseline.   On statin, lipids d/w pt.  Diet and exercise d/w pt.  Inc metformin to 500mg  BID and recheck in about 3 months.  He agrees.

## 2019-07-04 LAB — HM DIABETES EYE EXAM

## 2019-07-25 ENCOUNTER — Other Ambulatory Visit: Payer: Self-pay | Admitting: Family Medicine

## 2019-07-29 ENCOUNTER — Encounter: Payer: Self-pay | Admitting: Family Medicine

## 2019-07-29 ENCOUNTER — Encounter: Payer: Self-pay | Admitting: Ophthalmology

## 2019-09-13 ENCOUNTER — Other Ambulatory Visit: Payer: Self-pay

## 2019-09-13 ENCOUNTER — Ambulatory Visit (INDEPENDENT_AMBULATORY_CARE_PROVIDER_SITE_OTHER): Payer: 59 | Admitting: Family Medicine

## 2019-09-13 ENCOUNTER — Encounter: Payer: Self-pay | Admitting: Family Medicine

## 2019-09-13 VITALS — BP 122/70 | HR 82 | Temp 96.7°F | Ht 67.5 in | Wt 219.4 lb

## 2019-09-13 DIAGNOSIS — E119 Type 2 diabetes mellitus without complications: Secondary | ICD-10-CM

## 2019-09-13 LAB — POCT GLYCOSYLATED HEMOGLOBIN (HGB A1C): Hemoglobin A1C: 7.5 % — AB (ref 4.0–5.6)

## 2019-09-13 NOTE — Progress Notes (Signed)
This visit occurred during the SARS-CoV-2 public health emergency.  Safety protocols were in place, including screening questions prior to the visit, additional usage of staff PPE, and extensive cleaning of exam room while observing appropriate contact time as indicated for disinfecting solutions.  Diabetes:  Using medications without difficulties: now on 1000mg  metformin a day, up from 500mg  per day.  Hypoglycemic episodes: no Hyperglycemic episodes: no Feet problems:  no Blood Sugars averaging:  141 today, that is his typical baseline now.   eye exam within last year: yes A1c 7.5. d/w pt.  He is working more on diet and exercise.  A1c improved. He is walking more.    He is hopeful to get vaccinated for covid soon.    Meds, vitals, and allergies reviewed.   ROS: Per HPI unless specifically indicated in ROS section   GEN: nad, alert and oriented HEENT: ncat NECK: supple w/o LA CV: rrr. PULM: ctab, no inc wob ABD: soft, +bs EXT: no edema SKIN: no acute rash

## 2019-09-13 NOTE — Assessment & Plan Note (Signed)
Improved, A1c may lag his progress.  No change in meds.  Recheck in about 4 months.  See avs.  Continue work on diet and exercise.  He agrees.  I thanked him for his effort.

## 2019-09-13 NOTE — Patient Instructions (Signed)
Recheck in about 4 months.  A1c at the visit. You don't have to fast.   Thanks for your effort.  Update me as needed in the meantime.  Take care.  Glad to see you.

## 2019-10-23 ENCOUNTER — Other Ambulatory Visit: Payer: Self-pay | Admitting: Family Medicine

## 2019-11-07 ENCOUNTER — Other Ambulatory Visit: Payer: Self-pay | Admitting: Family Medicine

## 2020-01-16 ENCOUNTER — Ambulatory Visit: Payer: 59 | Admitting: Family Medicine

## 2020-01-24 ENCOUNTER — Ambulatory Visit (INDEPENDENT_AMBULATORY_CARE_PROVIDER_SITE_OTHER): Payer: 59 | Admitting: Family Medicine

## 2020-01-24 ENCOUNTER — Other Ambulatory Visit: Payer: Self-pay

## 2020-01-24 ENCOUNTER — Encounter: Payer: Self-pay | Admitting: Family Medicine

## 2020-01-24 VITALS — BP 130/84 | HR 64 | Temp 96.9°F | Ht 67.5 in | Wt 218.0 lb

## 2020-01-24 DIAGNOSIS — E119 Type 2 diabetes mellitus without complications: Secondary | ICD-10-CM | POA: Diagnosis not present

## 2020-01-24 LAB — POCT GLYCOSYLATED HEMOGLOBIN (HGB A1C): Hemoglobin A1C: 7.6 % — AB (ref 4.0–5.6)

## 2020-01-24 NOTE — Progress Notes (Signed)
This visit occurred during the SARS-CoV-2 public health emergency.  Safety protocols were in place, including screening questions prior to the visit, additional usage of staff PPE, and extensive cleaning of exam room while observing appropriate contact time as indicated for disinfecting solutions.  Diabetes:  Using medications without difficulties: yes, taking metformin 500mg  a day.   Hypoglycemic episodes: no Hyperglycemic episodes: no Feet problems: no Blood Sugars averaging: 120-150 eye exam within last year: yes A1c d/w pt at [facility].   He had his covid vaccine, d/w pt. He is still working from home.    His uncle died 07-26-2019 from covid, condolences offered.    Small R occipital LN noted on exam.  Feels well o/w. Advised patient to monitor for now.    Meds, vitals, and allergies reviewed.  ROS: Per HPI unless specifically indicated in ROS section   GEN: nad, alert and oriented HEENT: ncat, Small R occipital LN noted on exam.  No other lymphadenopathy noted on the neck occiput or pre-/postauricular areas bilaterally. NECK: supple w/o LA CV: rrr. PULM: ctab, no inc wob ABD: soft, +bs EXT: no edema SKIN: no acute rash  Diabetic foot exam: Normal inspection No skin breakdown No calluses  Normal DP pulses Normal sensation to light touch and monofilament Nails normal

## 2020-01-24 NOTE — Patient Instructions (Addendum)
Recheck at a physical in about 4 months.   Try increasing metformin to twice a day.   Keep using the treadmill.   Thanks for your effort.  Take care.  Glad to see you.

## 2020-01-25 NOTE — Assessment & Plan Note (Signed)
Small R occipital LN noted on exam.  Feels well o/w. Advised patient to monitor for now.    Recheck at a physical in about 4 months.   Try increasing metformin to twice a day.   Keep using the treadmill.   He will update me as needed.

## 2020-02-05 ENCOUNTER — Other Ambulatory Visit: Payer: Self-pay | Admitting: Family Medicine

## 2020-05-02 ENCOUNTER — Other Ambulatory Visit: Payer: Self-pay | Admitting: Family Medicine

## 2020-05-02 DIAGNOSIS — E119 Type 2 diabetes mellitus without complications: Secondary | ICD-10-CM

## 2020-05-07 ENCOUNTER — Other Ambulatory Visit: Payer: Self-pay | Admitting: Family Medicine

## 2020-05-09 IMAGING — DX DG ANKLE COMPLETE 3+V*L*
3 series · 3 of 3 positions shown · non-contrast
Comparison: None.

CLINICAL DATA: Left ankle injury, anterior pain and swelling

EXAM:
LEFT ANKLE COMPLETE - 3+ VIEW

[ankle ap]
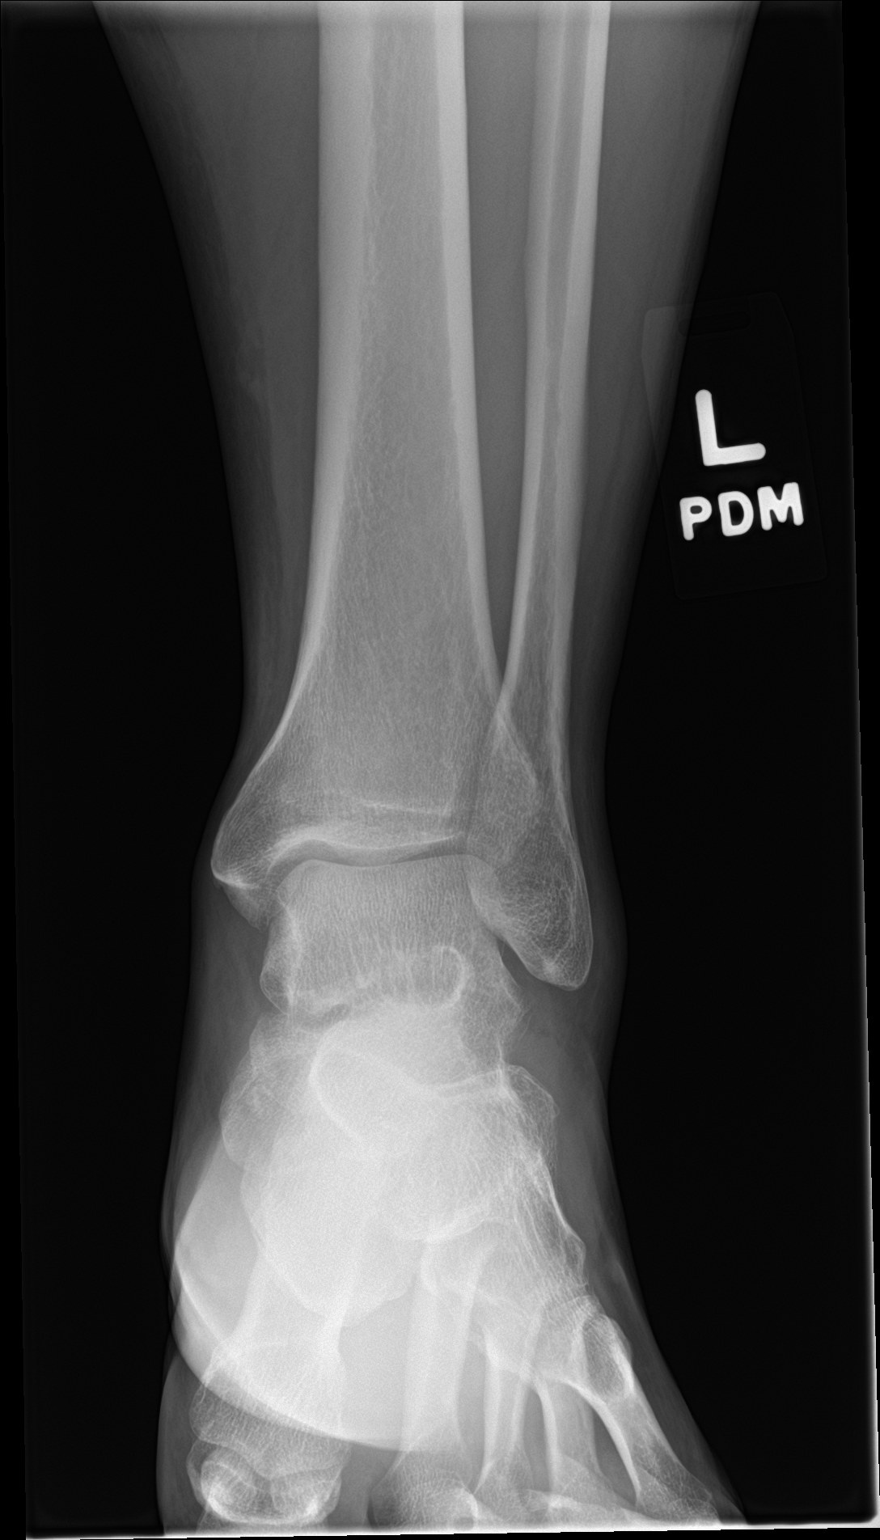

[ankle obl]
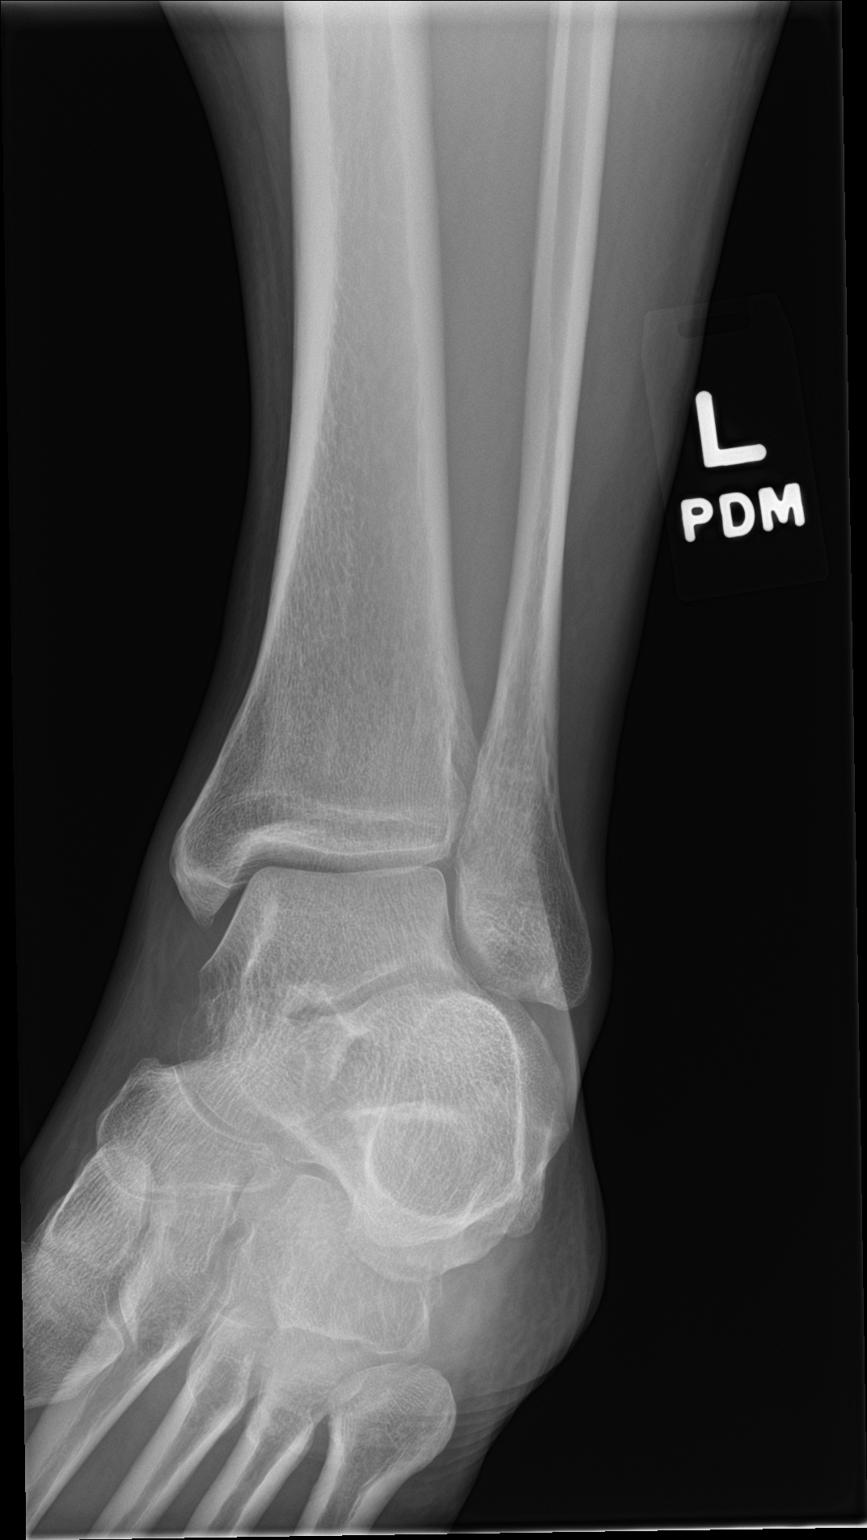

[ankle lat]
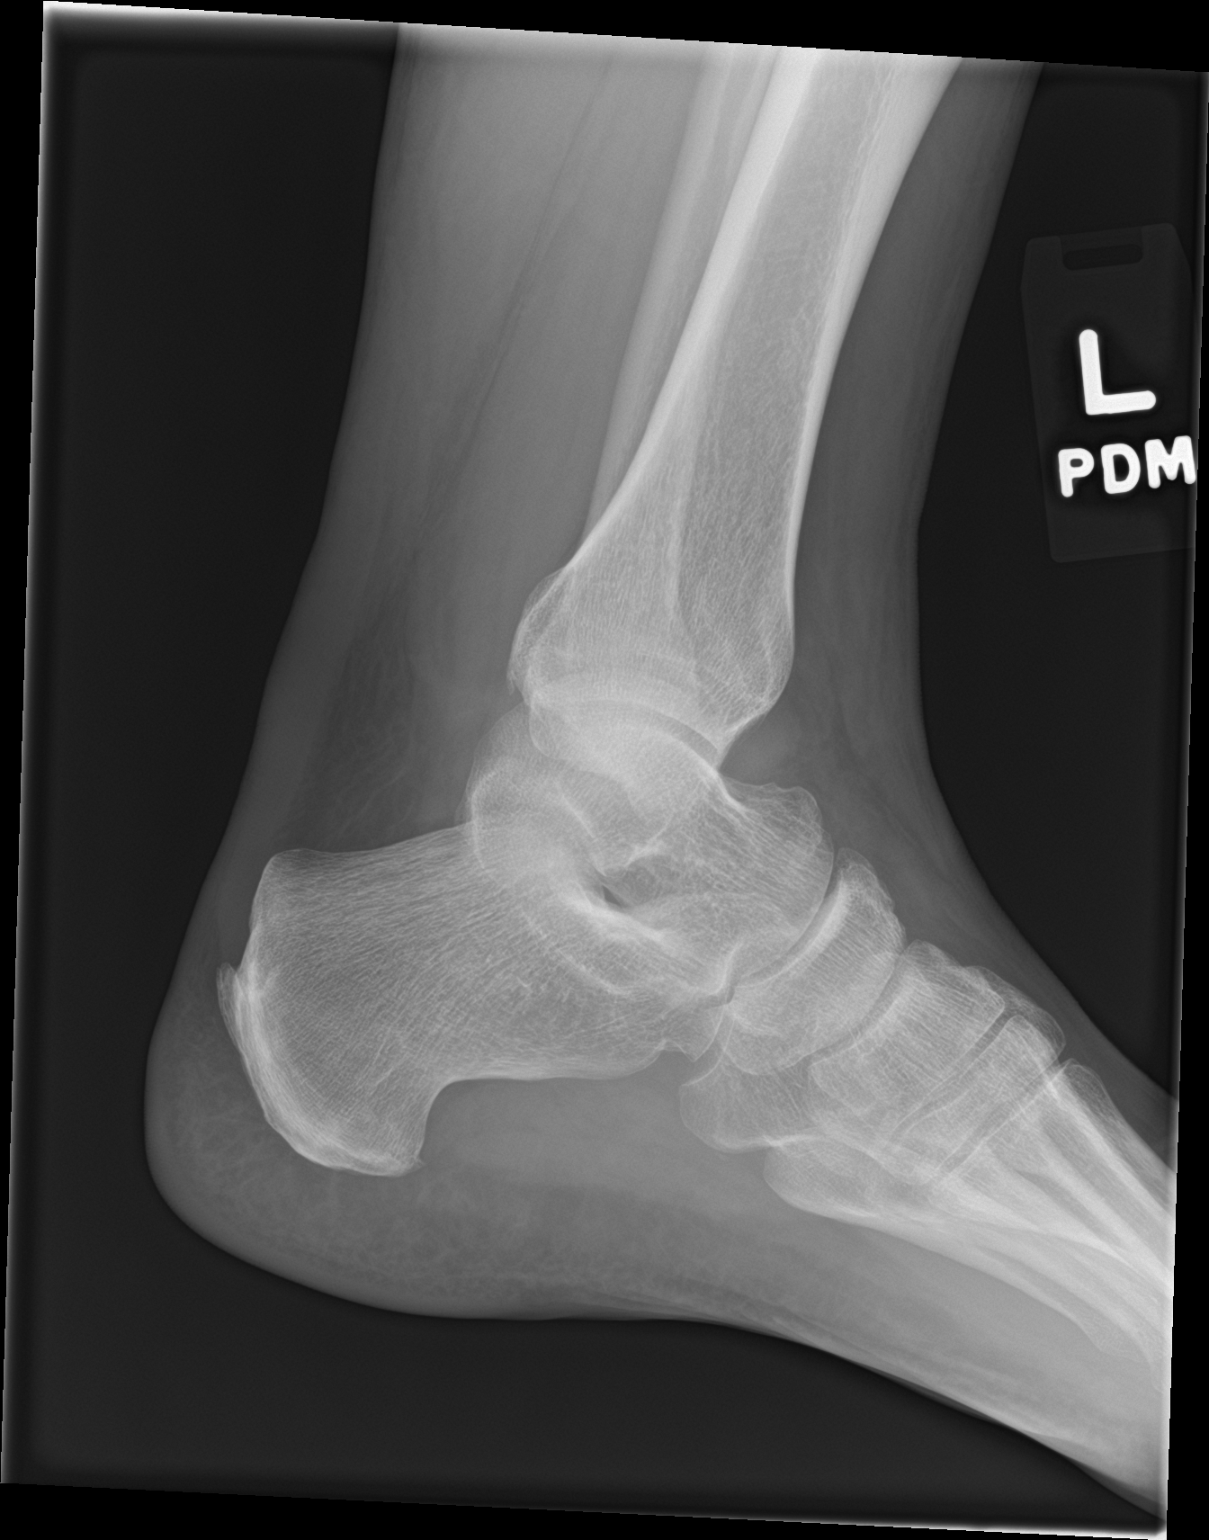

[3 of 3 positions shown; findings below may reference images not displayed]

FINDINGS: There is no evidence of fracture, dislocation, or joint effusion.
There is no evidence of arthropathy or other focal bone abnormality.
Soft tissues are unremarkable.
IMPRESSION: Negative.

## 2020-05-17 ENCOUNTER — Other Ambulatory Visit (INDEPENDENT_AMBULATORY_CARE_PROVIDER_SITE_OTHER): Payer: 59

## 2020-05-17 ENCOUNTER — Other Ambulatory Visit: Payer: Self-pay

## 2020-05-17 DIAGNOSIS — E119 Type 2 diabetes mellitus without complications: Secondary | ICD-10-CM | POA: Diagnosis not present

## 2020-05-17 LAB — COMPREHENSIVE METABOLIC PANEL
ALT: 59 U/L — ABNORMAL HIGH (ref 0–53)
AST: 33 U/L (ref 0–37)
Albumin: 3.8 g/dL (ref 3.5–5.2)
Alkaline Phosphatase: 89 U/L (ref 39–117)
BUN: 9 mg/dL (ref 6–23)
CO2: 30 mEq/L (ref 19–32)
Calcium: 8.6 mg/dL (ref 8.4–10.5)
Chloride: 101 mEq/L (ref 96–112)
Creatinine, Ser: 0.97 mg/dL (ref 0.40–1.50)
GFR: 92.18 mL/min (ref >60.00)
Glucose, Bld: 164 mg/dL — ABNORMAL HIGH (ref 70–99)
Potassium: 3.9 mEq/L (ref 3.5–5.1)
Sodium: 137 mEq/L (ref 135–145)
Total Bilirubin: 0.5 mg/dL (ref 0.2–1.2)
Total Protein: 6.2 g/dL (ref 6.0–8.3)

## 2020-05-17 LAB — LIPID PANEL
Cholesterol: 122 mg/dL (ref 0–200)
HDL: 28 mg/dL — ABNORMAL LOW (ref >39.00)
LDL Cholesterol: 72 mg/dL (ref 0–99)
NonHDL: 93.63
Total CHOL/HDL Ratio: 4
Triglycerides: 110 mg/dL (ref 0.0–149.0)
VLDL: 22 mg/dL (ref 0.0–40.0)

## 2020-05-17 LAB — HEMOGLOBIN A1C: Hgb A1c MFr Bld: 8.5 % — ABNORMAL HIGH (ref 4.6–6.5)

## 2020-05-17 LAB — URIC ACID: Uric Acid, Serum: 5.1 mg/dL (ref 4.0–7.8)

## 2020-05-21 ENCOUNTER — Encounter: Payer: Self-pay | Admitting: Family Medicine

## 2020-05-21 ENCOUNTER — Ambulatory Visit (INDEPENDENT_AMBULATORY_CARE_PROVIDER_SITE_OTHER): Payer: 59 | Admitting: Family Medicine

## 2020-05-21 ENCOUNTER — Other Ambulatory Visit: Payer: Self-pay

## 2020-05-21 VITALS — BP 130/82 | HR 67 | Temp 98.4°F | Ht 67.5 in | Wt 221.0 lb

## 2020-05-21 DIAGNOSIS — Z0000 Encounter for general adult medical examination without abnormal findings: Secondary | ICD-10-CM | POA: Diagnosis not present

## 2020-05-21 DIAGNOSIS — Z7189 Other specified counseling: Secondary | ICD-10-CM

## 2020-05-21 DIAGNOSIS — E119 Type 2 diabetes mellitus without complications: Secondary | ICD-10-CM

## 2020-05-21 DIAGNOSIS — Z8739 Personal history of other diseases of the musculoskeletal system and connective tissue: Secondary | ICD-10-CM

## 2020-05-21 DIAGNOSIS — E781 Pure hyperglyceridemia: Secondary | ICD-10-CM

## 2020-05-21 NOTE — Progress Notes (Signed)
This visit occurred during the SARS-CoV-2 public health emergency.  Safety protocols were in place, including screening questions prior to the visit, additional usage of staff PPE, and extensive cleaning of exam room while observing appropriate contact time as indicated for disinfecting solutions.  CPE- See plan.  Routine anticipatory guidance given to patient.  See health maintenance.  The possibility exists that previously documented standard health maintenance information may have been brought forward from a previous encounter into this note.  If needed, that same information has been updated to reflect the current situation based on today's encounter.    Tetanus 2020 Flu 2021 PNA up to date Shingles not due.  covid vaccine prev done 2021 HIV screening prev done.  Colon and prostate CA screening not due.  Living will d/w pt. Wife designated if patient were incapacitated.  Diet and exercise d/w pt.  HCV screening d/w pt.  He can consider.    Work situation d/w pt.   No recent gout sx.  Labs d/w pt.    Diabetes:  Using medications without difficulties: he had missed some PM doses of metformin.   Hypoglycemic episodes:no Hyperglycemic episodes:no Feet problems:no Blood Sugars averaging: 120-145 eye exam within last year: yes A1c up, d/w pt.  Labs d/w pt.    Elevated Cholesterol: Using medications without problems: yes Muscle aches: no Diet compliance: "not as good as I should." d/w pt.   Exercise: "not as good as I should." d/w pt.    PMH and SH reviewed  Meds, vitals, and allergies reviewed.   ROS: Per HPI.  Unless specifically indicated otherwise in HPI, the patient denies:  General: fever. Eyes: acute vision changes ENT: sore throat Cardiovascular: chest pain Respiratory: SOB GI: vomiting GU: dysuria Musculoskeletal: acute back pain Derm: acute rash Neuro: acute motor dysfunction Psych: worsening mood Endocrine: polydipsia Heme: bleeding Allergy:  hayfever  GEN: nad, alert and oriented HEENT: ncat NECK: supple w/o LA CV: rrr. PULM: ctab, no inc wob ABD: soft, +bs EXT: no edema SKIN: no acute rash  Diabetic foot exam: Normal inspection No skin breakdown No calluses  Normal DP pulses Normal sensation to light touch and monofilament Nails normal

## 2020-05-21 NOTE — Patient Instructions (Signed)
Set an alarm for the 2nd metformin dose, keep working on diet and exercise and recheck in 3 months with A1c at the visit.  Take care.  Glad to see you.

## 2020-05-23 NOTE — Assessment & Plan Note (Signed)
Continue pravastatin. Continue work on diet and exercise. Labs discussed with patient. He agrees with plan.

## 2020-05-23 NOTE — Assessment & Plan Note (Signed)
Living will d/w pt.  Wife designated if patient were incapacitated.   ?

## 2020-05-23 NOTE — Assessment & Plan Note (Signed)
No recent gout sx.  Labs d/w pt.

## 2020-05-23 NOTE — Assessment & Plan Note (Signed)
  Tetanus 2020 Flu 2021 PNA up to date Shingles not due.  covid vaccine prev done 2021 HIV screening prev done.  Colon and prostate CA screening not due.  Living will d/w pt. Wife designated if patient were incapacitated.  Diet and exercise d/w pt.  HCV screening d/w pt.  He can consider.

## 2020-05-23 NOTE — Assessment & Plan Note (Signed)
A1c up, d/w pt.  Labs d/w pt.   Advised him to set an alarm for the 2nd metformin dose, keep working on diet and exercise and recheck in 3 months with A1c at the visit. He agrees.

## 2020-07-19 LAB — HM DIABETES EYE EXAM

## 2020-08-03 ENCOUNTER — Other Ambulatory Visit: Payer: Self-pay | Admitting: Family Medicine

## 2020-08-21 ENCOUNTER — Other Ambulatory Visit: Payer: Self-pay

## 2020-08-21 ENCOUNTER — Encounter: Payer: Self-pay | Admitting: Family Medicine

## 2020-08-21 ENCOUNTER — Ambulatory Visit (INDEPENDENT_AMBULATORY_CARE_PROVIDER_SITE_OTHER): Payer: BC Managed Care – PPO | Admitting: Family Medicine

## 2020-08-21 VITALS — BP 114/76 | HR 69 | Temp 97.3°F | Ht 67.5 in | Wt 217.0 lb

## 2020-08-21 DIAGNOSIS — E119 Type 2 diabetes mellitus without complications: Secondary | ICD-10-CM

## 2020-08-21 LAB — POCT GLYCOSYLATED HEMOGLOBIN (HGB A1C): Hemoglobin A1C: 7.3 % — AB (ref 4.0–5.6)

## 2020-08-21 NOTE — Patient Instructions (Addendum)
Thanks for your effort.  Take care.  Glad to see you. Let me know when you need refills and let us know about the specific pharmacy.  Plan on recheck with A1c at the visit in about 4 months.

## 2020-08-21 NOTE — Progress Notes (Unsigned)
This visit occurred during the SARS-CoV-2 public health emergency.  Safety protocols were in place, including screening questions prior to the visit, additional usage of staff PPE, and extensive cleaning of exam room while observing appropriate contact time as indicated for disinfecting solutions.  Diabetes:  Using medications without difficulties: yes, he is using an alarm to get the 2nd metformin dose and that helps.   Hypoglycemic episodes: no Hyperglycemic episodes: no Feet problems:no Blood Sugars averaging: ~120s eye exam within last year: yes A1c 7.3, d/w pt.   Weight is lower, d/w pt.  Intentional weight loss.   He got an elliptical at [facility] and that helps with exercise.   Meds, vitals, and allergies reviewed.  ROS: Per HPI unless specifically indicated in ROS section   GEN: nad, alert and oriented HEENT: ncat NECK: supple w/o LA CV: rrr. PULM: ctab, no inc wob ABD: soft, +bs EXT: no edema SKIN: well perfused.

## 2020-08-22 NOTE — Assessment & Plan Note (Signed)
Using medications without difficulties: yes, he is using an alarm to get the 2nd metformin dose and that helps.   A1c 7.3, d/w pt.   Weight is lower, d/w pt.  Intentional weight loss.   He got an elliptical at [facility] and that helps with exercise.  I thanked him for his effort.  Continue as is and recheck periodically.  See after visit summary.  He agrees with plan.

## 2020-10-18 ENCOUNTER — Other Ambulatory Visit: Payer: Self-pay | Admitting: Family Medicine

## 2020-11-16 ENCOUNTER — Other Ambulatory Visit: Payer: Self-pay

## 2020-11-16 MED ORDER — LISINOPRIL 2.5 MG PO TABS: 2.5000 mg | ORAL_TABLET | Freq: Every day | ORAL | 1 refills | Status: DC

## 2020-11-16 MED ORDER — PRAVASTATIN SODIUM 10 MG PO TABS: 10.0000 mg | ORAL_TABLET | Freq: Every day | ORAL | 1 refills | Status: DC

## 2020-12-21 ENCOUNTER — Other Ambulatory Visit: Payer: Self-pay

## 2020-12-21 ENCOUNTER — Encounter: Payer: Self-pay | Admitting: Family Medicine

## 2020-12-21 ENCOUNTER — Ambulatory Visit (INDEPENDENT_AMBULATORY_CARE_PROVIDER_SITE_OTHER): Payer: BC Managed Care – PPO | Admitting: Family Medicine

## 2020-12-21 VITALS — BP 118/82 | HR 80 | Temp 97.2°F | Ht 67.5 in | Wt 215.0 lb

## 2020-12-21 DIAGNOSIS — E119 Type 2 diabetes mellitus without complications: Secondary | ICD-10-CM | POA: Diagnosis not present

## 2020-12-21 DIAGNOSIS — Z125 Encounter for screening for malignant neoplasm of prostate: Secondary | ICD-10-CM

## 2020-12-21 LAB — POCT GLYCOSYLATED HEMOGLOBIN (HGB A1C): Hemoglobin A1C: 7.2 % — AB (ref 4.0–5.6)

## 2020-12-21 NOTE — Progress Notes (Signed)
This visit occurred during the SARS-CoV-2 public health emergency.  Safety protocols were in place, including screening questions prior to the visit, additional usage of staff PPE, and extensive cleaning of exam room while observing appropriate contact time as indicated for disinfecting solutions.  Diabetes:  Using medications without difficulties: yes, metformin BID.   Hypoglycemic episodes: no Hyperglycemic episodes: no Feet problems: no Blood Sugars averaging: usually 115-150 eye exam within last year: yes A1c 7.2 Intentional weight loss.  D/w pt.  Diet and exercise d/w pt.    He is back in the office, commuting.  D/w pt.    D/w pt about PSA screening at next cpe.  Reasonable to check given FH.  D/w patient IO:MBTDHRC for colon cancer screening, including IFOB vs. Colonoscopy vs cologuard.  Risks and benefits of both were discussed and patient voiced understanding.  Pt can consider in the meantime.    Meds, vitals, and allergies reviewed.  ROS: Per HPI unless specifically indicated in ROS section   GEN: nad, alert and oriented HEENT: ncat NECK: supple w/o LA CV: rrr. PULM: ctab, no inc wob ABD: soft, +bs EXT: no edema SKIN: well perfused.

## 2020-12-21 NOTE — Assessment & Plan Note (Signed)
A1c 7.2 Continue statin ACE and metformin.   Intentional weight loss.  D/w pt.  Diet and exercise d/w pt. Recheck periodically.

## 2020-12-21 NOTE — Patient Instructions (Signed)
Thanks for your effort.  Plan on recheck at a yearly visit in the fall, labs ahead of time if possible.  Take care.  Glad to see you.

## 2021-01-04 ENCOUNTER — Telehealth (INDEPENDENT_AMBULATORY_CARE_PROVIDER_SITE_OTHER): Payer: BC Managed Care – PPO | Admitting: Family Medicine

## 2021-01-04 ENCOUNTER — Encounter: Payer: Self-pay | Admitting: Family Medicine

## 2021-01-04 VITALS — Temp 97.8°F | Ht 67.5 in | Wt 216.0 lb

## 2021-01-04 DIAGNOSIS — E119 Type 2 diabetes mellitus without complications: Secondary | ICD-10-CM | POA: Diagnosis not present

## 2021-01-04 DIAGNOSIS — J209 Acute bronchitis, unspecified: Secondary | ICD-10-CM | POA: Diagnosis not present

## 2021-01-04 MED ORDER — CHERATUSSIN AC 100-10 MG/5ML PO SOLN: 5.0000 mL | Freq: Three times a day (TID) | ORAL | 0 refills | Status: DC | PRN

## 2021-01-04 NOTE — Assessment & Plan Note (Addendum)
Anticipate viral. Supportive care reviewed. Add cheratussin codeine cough syrup to mucinex and NSAID. Red flags to seek in person care reviewed. Pt agrees with plan.

## 2021-01-04 NOTE — Progress Notes (Signed)
Patient ID: Bryan Doyle, male    DOB: 1970-08-08, 50 y.o.   MRN: [id number]  Virtual visit completed through [employer], a video enabled telemedicine application. Due to national recommendations of social distancing due to COVID-19, a virtual visit is felt to be most appropriate for this patient at this time. Reviewed limitations, risks, security and privacy concerns of performing a virtual visit and the availability of in person appointments. I also reviewed that there may be a patient responsible charge related to this service. The patient agreed to proceed.   Patient location: home Provider location:  at [facility], office Persons participating in this virtual visit: patient, provider   If any vitals were documented, they were collected by patient at home unless specified below.    Temp 97.8 F (36.6 C)   Ht 5' 7.5" (1.715 m)   Wt 216 lb (98 kg)   BMI 33.33 kg/m    CC: cough Subjective:   HPI: Bryan Doyle is a 50 y.o. male presenting on 01/04/2021 for Cough (C/o cough with occasional phlegm, nasal congestion and drainage- green.  Sxs started about 1 week ago.  3 neg home COVID tests.  Had [employer] booster, 04/16/20. )   9d h/o productive cough associated with chest congestion, blowing nose with green mucous. PNdrainage. Cough affecting sleep.   No fevers/chills, ear or tooth pain, body aches, ST. No dyspnea or wheezing.  No sick contacts at home.   Has tested for COVID x3 at home - all negative.  Treating at home with mucinex, advil cold/sinus.   Known diabetic latest A1c 7.2%.  H/o exercise induced asthma as child.  Non smoker.      Relevant past medical, surgical, family and social history reviewed and updated as indicated. Interim medical history since our last visit reviewed. Allergies and medications reviewed and updated. Outpatient Medications Prior to Visit  Medication Sig Dispense Refill   Cholecalciferol (VITAMIN D) 125 MCG (5000 UT) CAPS  Take 1 tablet by mouth.     FREESTYLE LITE test strip TEST BLOOD SUGAR ONCE A DAY AND AS DIRECTED 100 each 3   indomethacin (INDOCIN) 50 MG capsule Take 1 capsule (50 mg total) by mouth 3 (three) times daily as needed (with food).     Lancets (FREESTYLE) lancets Check blood sugar once daily and as directed. Dx E11.9 100 each 3   lisinopril (ZESTRIL) 2.5 MG tablet Take 1 tablet (2.5 mg total) by mouth daily. 90 tablet 1   loratadine (CLARITIN) 10 MG tablet Take 10 mg by mouth daily.     metFORMIN (GLUCOPHAGE) 500 MG tablet TAKE 1 TABLET TWICE A DAY WITH MEALS 180 tablet 3   pravastatin (PRAVACHOL) 10 MG tablet Take 1 tablet (10 mg total) by mouth daily. 90 tablet 1   triamcinolone cream (KENALOG) 0.1 % Apply 1 application topically 2 (two) times daily as needed. 30 g 1   No facility-administered medications prior to visit.     Per HPI unless specifically indicated in ROS section below Review of Systems Objective:  Temp 97.8 F (36.6 C)   Ht 5' 7.5" (1.715 m)   Wt 216 lb (98 kg)   BMI 33.33 kg/m   Wt Readings from Last 3 Encounters:  01/04/21 216 lb (98 kg)  12/21/20 215 lb (97.5 kg)  08/21/20 217 lb (98.4 kg)       Physical exam: Gen: alert, NAD, not ill appearing Pulm: speaks in complete sentences without increased work of breathing, cough  intermittently present during visit Psych: normal mood, normal thought content      Results for orders placed or performed in visit on 12/21/20  POCT glycosylated hemoglobin (Hb A1C)  Result Value Ref Range   Hemoglobin A1C 7.2 (A) 4.0 - 5.6 %   HbA1c POC (<> result, manual entry)     HbA1c, POC (prediabetic range)     HbA1c, POC (controlled diabetic range)     Assessment & Plan:   Problem List Items Addressed This Visit     Diabetes mellitus without complication ([place])   Acute bronchitis - Primary    Anticipate viral. Supportive care reviewed. Add cheratussin codeine cough syrup to mucinex and NSAID. Red flags to seek in person care  reviewed. Pt agrees with plan.          Meds ordered this encounter  Medications   guaiFENesin-codeine (CHERATUSSIN AC) 100-10 MG/5ML syrup    Sig: Take 5 mLs by mouth 3 (three) times daily as needed for cough.    Dispense:  120 mL    Refill:  0    No orders of the defined types were placed in this encounter.   I discussed the assessment and treatment plan with the patient. The patient was provided an opportunity to ask questions and all were answered. The patient agreed with the plan and demonstrated an understanding of the instructions. The patient was advised to call back or seek an in-person evaluation if the symptoms worsen or if the condition fails to improve as anticipated.  Follow up plan: Return if symptoms worsen or fail to improve.  Ria Bush, MD

## 2021-01-16 DIAGNOSIS — H21233 Degeneration of iris (pigmentary), bilateral: Secondary | ICD-10-CM | POA: Diagnosis not present

## 2021-03-12 ENCOUNTER — Telehealth: Payer: Self-pay | Admitting: Family Medicine

## 2021-03-12 MED ORDER — METFORMIN HCL 500 MG PO TABS: 500.0000 mg | ORAL_TABLET | Freq: Two times a day (BID) | ORAL | 3 refills | Status: DC

## 2021-03-12 NOTE — Telephone Encounter (Signed)
Rx sent 

## 2021-03-12 NOTE — Telephone Encounter (Signed)
  Encourage patient to contact the pharmacy for refills or they can request refills through [place]:  12/21/20  NEXT APPOINTMENT DATE:  MEDICATION: Metformin  Is the patient out of medication?   PHARMACY: [facility]  Let patient know to contact pharmacy at the end of the day to make sure medication is ready.  Please notify patient to allow 48-72 hours to process  CLINICAL FILLS OUT ALL BELOW:   LAST REFILL:  QTY:  REFILL DATE:    OTHER COMMENTS:    Okay for refill?  Please advise

## 2021-03-12 NOTE — Addendum Note (Signed)
Addended by: Sherrilee Gilles B on: 03/12/2021 04:18 PM   Modules accepted: Orders

## 2021-04-05 ENCOUNTER — Encounter: Payer: Self-pay | Admitting: General Surgery

## 2021-05-13 ENCOUNTER — Telehealth: Payer: Self-pay | Admitting: Family Medicine

## 2021-05-13 MED ORDER — LISINOPRIL 2.5 MG PO TABS: 2.5000 mg | ORAL_TABLET | Freq: Every day | ORAL | 1 refills | Status: DC

## 2021-05-13 NOTE — Addendum Note (Signed)
Addended by: Sherrilee Gilles B on: 05/13/2021 12:12 PM   Modules accepted: Orders

## 2021-05-13 NOTE — Telephone Encounter (Signed)
Rx sent and patient notified.

## 2021-05-13 NOTE — Telephone Encounter (Signed)
Pt needs a refill on lisinopril (ZESTRIL) 2.5 MG tablet sent to [facility]

## 2021-05-15 ENCOUNTER — Other Ambulatory Visit: Payer: Self-pay | Admitting: Family Medicine

## 2021-05-16 ENCOUNTER — Other Ambulatory Visit: Payer: BC Managed Care – PPO

## 2021-05-24 ENCOUNTER — Ambulatory Visit (INDEPENDENT_AMBULATORY_CARE_PROVIDER_SITE_OTHER): Payer: BC Managed Care – PPO | Admitting: Family Medicine

## 2021-05-24 ENCOUNTER — Encounter: Payer: Self-pay | Admitting: Family Medicine

## 2021-05-24 ENCOUNTER — Other Ambulatory Visit: Payer: Self-pay

## 2021-05-24 VITALS — BP 120/84 | HR 75 | Temp 97.3°F | Ht 67.5 in | Wt 217.0 lb

## 2021-05-24 DIAGNOSIS — E781 Pure hyperglyceridemia: Secondary | ICD-10-CM

## 2021-05-24 DIAGNOSIS — E119 Type 2 diabetes mellitus without complications: Secondary | ICD-10-CM | POA: Diagnosis not present

## 2021-05-24 DIAGNOSIS — Z125 Encounter for screening for malignant neoplasm of prostate: Secondary | ICD-10-CM

## 2021-05-24 DIAGNOSIS — Z8739 Personal history of other diseases of the musculoskeletal system and connective tissue: Secondary | ICD-10-CM

## 2021-05-24 DIAGNOSIS — Z7189 Other specified counseling: Secondary | ICD-10-CM

## 2021-05-24 DIAGNOSIS — Z0000 Encounter for general adult medical examination without abnormal findings: Secondary | ICD-10-CM | POA: Diagnosis not present

## 2021-05-24 LAB — CBC WITH DIFFERENTIAL/PLATELET
Basophils Absolute: 0 10*3/uL (ref 0.0–0.1)
Basophils Relative: 0.5 % (ref 0.0–3.0)
Eosinophils Absolute: 0.2 10*3/uL (ref 0.0–0.7)
Eosinophils Relative: 1.8 % (ref 0.0–5.0)
HCT: 44.9 % (ref 39.0–52.0)
Hemoglobin: 15 g/dL (ref 13.0–17.0)
Lymphocytes Relative: 26 % (ref 12.0–46.0)
Lymphs Abs: 2.5 10*3/uL (ref 0.7–4.0)
MCHC: 33.5 g/dL (ref 30.0–36.0)
MCV: 88.3 fl (ref 78.0–100.0)
Monocytes Absolute: 0.6 10*3/uL (ref 0.1–1.0)
Monocytes Relative: 6.7 % (ref 3.0–12.0)
Neutro Abs: 6.2 10*3/uL (ref 1.4–7.7)
Neutrophils Relative %: 65 % (ref 43.0–77.0)
Platelets: 176 10*3/uL (ref 150.0–400.0)
RBC: 5.08 Mil/uL (ref 4.22–5.81)
RDW: 13.4 % (ref 11.5–15.5)
WBC: 9.5 10*3/uL (ref 4.0–10.5)

## 2021-05-24 LAB — LIPID PANEL
Cholesterol: 134 mg/dL (ref 0–200)
HDL: 30.8 mg/dL — ABNORMAL LOW (ref >39.00)
LDL Cholesterol: 77 mg/dL (ref 0–99)
NonHDL: 103.23
Total CHOL/HDL Ratio: 4
Triglycerides: 130 mg/dL (ref 0.0–149.0)
VLDL: 26 mg/dL (ref 0.0–40.0)

## 2021-05-24 LAB — URIC ACID: Uric Acid, Serum: 6.1 mg/dL (ref 4.0–7.8)

## 2021-05-24 LAB — PSA: PSA: 0.41 ng/mL (ref 0.10–4.00)

## 2021-05-24 LAB — COMPREHENSIVE METABOLIC PANEL
ALT: 63 U/L — ABNORMAL HIGH (ref 0–53)
AST: 38 U/L — ABNORMAL HIGH (ref 0–37)
Albumin: 4.2 g/dL (ref 3.5–5.2)
Alkaline Phosphatase: 94 U/L (ref 39–117)
BUN: 11 mg/dL (ref 6–23)
CO2: 30 mEq/L (ref 19–32)
Calcium: 9.1 mg/dL (ref 8.4–10.5)
Chloride: 100 mEq/L (ref 96–112)
Creatinine, Ser: 0.9 mg/dL (ref 0.40–1.50)
GFR: 100.13 mL/min (ref >60.00)
Glucose, Bld: 128 mg/dL — ABNORMAL HIGH (ref 70–99)
Potassium: 3.8 mEq/L (ref 3.5–5.1)
Sodium: 138 mEq/L (ref 135–145)
Total Bilirubin: 0.8 mg/dL (ref 0.2–1.2)
Total Protein: 6.5 g/dL (ref 6.0–8.3)

## 2021-05-24 LAB — HEMOGLOBIN A1C: Hgb A1c MFr Bld: 7.8 % — ABNORMAL HIGH (ref 4.6–6.5)

## 2021-05-24 NOTE — Patient Instructions (Addendum)
Let me know about cologuard coverage.  Go to the lab on the way out.   If you have mychart we'll likely use that to update you.    Take care.  Glad to see you. Plan on recheck in about 6 months with A1c at the visit.  Update me as needed in the meantime.

## 2021-05-24 NOTE — Progress Notes (Signed)
This visit occurred during the SARS-CoV-2 public health emergency.  Safety protocols were in place, including screening questions prior to the visit, additional usage of staff PPE, and extensive cleaning of exam room while observing appropriate contact time as indicated for disinfecting solutions.  CPE- See plan.  Routine anticipatory guidance given to patient.  See health maintenance.  The possibility exists that previously documented standard health maintenance information may have been brought forward from a previous encounter into this note.  If needed, that same information has been updated to reflect the current situation based on today's encounter.    Tetanus 2020 Flu 2022 PNA up to date Shingles not due.  covid vaccine prev done 2021 HIV screening prev done.  PSA pending 2022 Colon cancer screening d/w pt.  He is going to check on cologuard coverage and update me. Living will d/w pt.  Wife designated if patient were incapacitated.   Diet and exercise d/w pt.  he has an elliptical.    Diabetes:  Using medications without difficulties: yes Hypoglycemic episodes: no Hyperglycemic episodes: no Feet problems: no Blood Sugars averaging: usually 115-145 eye exam within last year: yes  Elevated Cholesterol: Using medications without problems: yes Muscle aches: no Diet compliance: d/w pt.  Exercise: d/w pt.    No recent gout sx.    His mother in law died this year, with dementia.  Condolences offered.  PMH and SH reviewed  Meds, vitals, and allergies reviewed.   ROS: Per HPI.  Unless specifically indicated otherwise in HPI, the patient denies:  General: fever. Eyes: acute vision changes ENT: sore throat Cardiovascular: chest pain Respiratory: SOB GI: vomiting GU: dysuria Musculoskeletal: acute back pain Derm: acute rash Neuro: acute motor dysfunction Psych: worsening mood Endocrine: polydipsia Heme: bleeding Allergy: hayfever  GEN: nad, alert and oriented HEENT:  ncat NECK: supple w/o LA CV: rrr. PULM: ctab, no inc wob ABD: soft, +bs EXT: no edema SKIN: no acute rash  Diabetic foot exam: Normal inspection No skin breakdown No calluses  Normal DP pulses Normal sensation to light touch and monofilament Nails normal

## 2021-05-25 NOTE — Assessment & Plan Note (Signed)
No recent symptoms.  See notes on labs.

## 2021-05-25 NOTE — Assessment & Plan Note (Signed)
Continue pravastatin metformin and lisinopril.  See notes on labs.  Continue work on diet and exercise.  Recheck periodically.

## 2021-05-25 NOTE — Assessment & Plan Note (Signed)
Living will d/w pt.  Wife designated if patient were incapacitated.   ?

## 2021-05-25 NOTE — Assessment & Plan Note (Signed)
Continue pravastatin.  See notes on labs.  Continue work on diet and exercise.  Recheck periodically.

## 2021-05-25 NOTE — Assessment & Plan Note (Signed)
Tetanus 2020 Flu 2022 PNA up to date Shingles not due.  covid vaccine prev done 2021 HIV screening prev done.  PSA pending 2022 Colon cancer screening d/w pt.  He is going to check on cologuard coverage and update me. Living will d/w pt.  Wife designated if patient were incapacitated.   Diet and exercise d/w pt.  he has an elliptical.

## 2021-07-22 DIAGNOSIS — H21233 Degeneration of iris (pigmentary), bilateral: Secondary | ICD-10-CM | POA: Diagnosis not present

## 2021-07-22 LAB — HM DIABETES EYE EXAM

## 2021-08-09 ENCOUNTER — Telehealth: Payer: Self-pay | Admitting: Family Medicine

## 2021-08-09 ENCOUNTER — Other Ambulatory Visit: Payer: Self-pay

## 2021-08-09 MED ORDER — PRAVASTATIN SODIUM 10 MG PO TABS: ORAL_TABLET | ORAL | 1 refills | Status: DC

## 2021-08-09 NOTE — Telephone Encounter (Signed)
°  Encourage patient to contact the pharmacy for refills or they can request refills through [place]:  Please schedule appointment if longer than 1 year  NEXT APPOINTMENT DATE:  MEDICATION:pravastatin (PRAVACHOL) 10 MG tablet  Is the patient out of medication?  [place], [place]:  Let patient know to contact pharmacy at the end of the day to make sure medication is ready.  Please notify patient to allow 48-72 hours to process  CLINICAL FILLS OUT ALL BELOW:   LAST REFILL:  QTY:  REFILL DATE:    OTHER COMMENTS:    Okay for refill?  Please advise

## 2021-08-09 NOTE — Telephone Encounter (Signed)
Rx sent 

## 2021-08-16 ENCOUNTER — Ambulatory Visit (INDEPENDENT_AMBULATORY_CARE_PROVIDER_SITE_OTHER): Payer: BC Managed Care – PPO | Admitting: Family Medicine

## 2021-08-16 ENCOUNTER — Encounter: Payer: Self-pay | Admitting: Family Medicine

## 2021-08-16 ENCOUNTER — Other Ambulatory Visit: Payer: Self-pay

## 2021-08-16 VITALS — BP 124/82 | HR 69 | Temp 98.0°F | Ht 67.5 in | Wt 216.0 lb

## 2021-08-16 DIAGNOSIS — E119 Type 2 diabetes mellitus without complications: Secondary | ICD-10-CM

## 2021-08-16 LAB — POCT GLYCOSYLATED HEMOGLOBIN (HGB A1C): Hemoglobin A1C: 7.6 % — AB (ref 4.0–5.6)

## 2021-08-16 NOTE — Progress Notes (Signed)
This visit occurred during the SARS-CoV-2 public health emergency.  Safety protocols were in place, including screening questions prior to the visit, additional usage of staff PPE, and extensive cleaning of exam room while observing appropriate contact time as indicated for disinfecting solutions.  Diabetes:  Using medications without difficulties: yes Hypoglycemic episodes:no  Hyperglycemic episodes: no Feet problems: no Blood Sugars averaging: 139 today, usually 110-140 eye exam within last year: recently done, in the last month.  Was seen at [facility] A1c d/w pt at [place].  Slightly lower today.   Work stressors d/w pt.  He is travelling for work soon.   Right now diet is better than exercise pattern and he is going to try to work on that.  He is working a lot of hours.    I asked him to check on colon cancer screening coverage, d/w pt.   Meds, vitals, and allergies reviewed.  ROS: Per HPI unless specifically indicated in ROS section   GEN: nad, alert and oriented HEENT: ncat NECK: supple w/o LA CV: rrr. PULM: ctab, no inc wob ABD: soft, +bs EXT: no edema SKIN: well perfused.

## 2021-08-16 NOTE — Patient Instructions (Addendum)
Check on cologuard coverage and let me know.   Take care.  Glad to see you. Assuming your sugar is coming down, then recheck around June with A1c at the visit.  If higher in the meantime then let me know.

## 2021-08-18 NOTE — Assessment & Plan Note (Signed)
A1c d/w pt at [facility].  Slightly lower today.   Work stressors d/w pt.  He is travelling for work soon.   Right now diet is better than exercise pattern and he is going to try to work on that.  He is working a lot of hours.   Continue metformin.  He will see if he can improve his sugars through diet and exercise. Assuming his sugar is coming down, then recheck around June with A1c at the visit.  If higher in the meantime then he can let me know.

## 2021-11-05 ENCOUNTER — Other Ambulatory Visit: Payer: Self-pay | Admitting: Family Medicine

## 2021-11-21 ENCOUNTER — Ambulatory Visit (INDEPENDENT_AMBULATORY_CARE_PROVIDER_SITE_OTHER): Payer: BC Managed Care – PPO | Admitting: Family Medicine

## 2021-11-21 ENCOUNTER — Telehealth: Payer: Self-pay

## 2021-11-21 ENCOUNTER — Encounter: Payer: Self-pay | Admitting: Family Medicine

## 2021-11-21 VITALS — BP 140/84 | HR 73 | Temp 97.6°F | Ht 67.5 in | Wt 214.0 lb

## 2021-11-21 DIAGNOSIS — E119 Type 2 diabetes mellitus without complications: Secondary | ICD-10-CM | POA: Diagnosis not present

## 2021-11-21 DIAGNOSIS — Z1211 Encounter for screening for malignant neoplasm of colon: Secondary | ICD-10-CM

## 2021-11-21 LAB — POCT GLYCOSYLATED HEMOGLOBIN (HGB A1C): Hemoglobin A1C: 7.3 % — AB (ref 4.0–5.6)

## 2021-11-21 NOTE — Telephone Encounter (Signed)
Pt would like a call back in ref to procedure

## 2021-11-21 NOTE — Progress Notes (Signed)
Diabetes:  Using medications without difficulties: yes Hypoglycemic episodes: no Hyperglycemic episodes: no Feet problems:no Blood Sugars averaging: 115-140 eye exam within last year: yes A1c 7.3.  d/w pt at [facility].   Weight is down 2 lbs from prev [facility].  D/w pt.  D/w pt about meals.  Breakfast is cereal vs eggs depending on schedule/etc, lunch is a small low carb snack or skipped.  D/w pt about not skipping a meal.  Dinner is cooked at home usually, restaurants some of the time.  He is walking more and that likely helped with the A1c.     D/w patient QZ:[id number] for colon cancer screening, including IFOB vs. colonoscopy.  Risks and benefits of both were discussed and patient voiced understanding.  Pt elects for colonoscopy.    Meds, vitals, and allergies reviewed.  ROS: Per HPI unless specifically indicated in ROS section   GEN: nad, alert and oriented HEENT: ncat NECK: supple w/o LA CV: rrr. PULM: ctab, no inc wob ABD: soft, +bs EXT: no edema SKIN: well perfused.

## 2021-11-21 NOTE — Patient Instructions (Addendum)
Keep exercising and try to pick one diet item to focus on for now.   Thanks for your effort.  Take care.  Glad to see you. Plan on recheck in about 6 months at a physical with labs ahead of time if possible.   Update me as needed.

## 2021-11-21 NOTE — Telephone Encounter (Signed)
Gastroenterology Pre-Procedure Review  Request Date: TBD Requesting Physician: Dr. Dellie Catholic  PATIENT REVIEW QUESTIONS: The patient responded to the following health history questions as indicated:    1. Are you having any GI issues? no 2. Do you have a personal history of Polyps? no 3. Do you have a family history of Colon Cancer or Polyps? no 4. Diabetes Mellitus? yes (type 2) 5. Joint replacements in the past 12 months?no 6. Major health problems in the past 3 months?no 7. Any artificial heart valves, MVP, or defibrillator?no    MEDICATIONS & ALLERGIES:    Patient reports the following regarding taking any anticoagulation/antiplatelet therapy:   Plavix, Coumadin, Eliquis, Xarelto, Lovenox, Pradaxa, Brilinta, or Effient? no Aspirin? no  Patient confirms/reports the following medications:  Current Outpatient Medications  Medication Sig Dispense Refill   Cholecalciferol (VITAMIN D) 125 MCG (5000 UT) CAPS Take 1 tablet by mouth.     FREESTYLE LITE test strip TEST BLOOD SUGAR ONCE A DAY AND AS DIRECTED 100 each 3   indomethacin (INDOCIN) 50 MG capsule Take 1 capsule (50 mg total) by mouth 3 (three) times daily as needed (with food).     Lancets (FREESTYLE) lancets Check blood sugar once daily and as directed. Dx E11.9 100 each 3   lisinopril (ZESTRIL) 2.5 MG tablet TAKE 1 TABLET(2.5 MG) BY MOUTH DAILY 90 tablet 1   loratadine (CLARITIN) 10 MG tablet Take 10 mg by mouth daily.     metFORMIN (GLUCOPHAGE) 500 MG tablet Take 1 tablet (500 mg total) by mouth 2 (two) times daily with a meal. 180 tablet 3   pravastatin (PRAVACHOL) 10 MG tablet TAKE 1 TABLET(10 MG) BY MOUTH DAILY 90 tablet 1   triamcinolone cream (KENALOG) 0.1 % Apply 1 application topically 2 (two) times daily as needed. 30 g 1   No current facility-administered medications for this visit.    Patient confirms/reports the following allergies:  Allergies  Allergen Reactions   Penicillins     REACTION: u/k- was told in infancy  to avoid   Sulfa Antibiotics Rash    Severe rash and fever    No orders of the defined types were placed in this encounter.   AUTHORIZATION INFORMATION Primary Insurance: 1D#: Group #:  Secondary Insurance: 1D#: Group #:  SCHEDULE INFORMATION: Date: TBD Time: Location: TBD

## 2021-11-21 NOTE — Assessment & Plan Note (Signed)
Continue metformin, d/w pt about diet and exercise.  Recheck in 6 months.  See avs.

## 2021-11-22 ENCOUNTER — Other Ambulatory Visit: Payer: Self-pay

## 2021-11-22 DIAGNOSIS — Z1211 Encounter for screening for malignant neoplasm of colon: Secondary | ICD-10-CM

## 2021-11-22 MED ORDER — NA SULFATE-K SULFATE-MG SULF 17.5-3.13-1.6 GM/177ML PO SOLN: 1.0000 | Freq: Once | ORAL | 0 refills | Status: AC

## 2021-11-26 ENCOUNTER — Telehealth: Payer: Self-pay | Admitting: Gastroenterology

## 2021-11-26 ENCOUNTER — Telehealth: Payer: Self-pay

## 2021-11-26 NOTE — Telephone Encounter (Signed)
Returned patients call letting him know that I received his message to cancel his colonoscopy and he can call back when he is ready to reschedule.  Torrie in Endo has been notified of cancellation.  Thanks, [employer]

## 2021-11-26 NOTE — Telephone Encounter (Signed)
PT left message to cancel procedure for 06/13 and will call back to reschedule.

## 2021-12-09 ENCOUNTER — Telehealth: Payer: Self-pay

## 2021-12-09 NOTE — Telephone Encounter (Signed)
Patient has requested to reschedule colonoscopy.  Colonoscopy has been rescheduled to 01/27/22.  Rx was returned back to the shelf pt has requested new rx to be sent.

## 2021-12-20 ENCOUNTER — Ambulatory Visit: Payer: BC Managed Care – PPO | Admitting: Family Medicine

## 2022-01-22 ENCOUNTER — Other Ambulatory Visit: Payer: Self-pay

## 2022-01-22 MED ORDER — NA SULFATE-K SULFATE-MG SULF 17.5-3.13-1.6 GM/177ML PO SOLN
1.0000 | Freq: Once | ORAL | 0 refills | Status: AC
Start: 2022-01-22 — End: 2022-01-22

## 2022-01-22 NOTE — Progress Notes (Signed)
Suprep rx sent to [facility] on [address].  Patient advised to hold all morning meds, take half of the usual dose of diabetic medications the evening before.  Thanks, [place], [place]

## 2022-01-27 ENCOUNTER — Ambulatory Visit: Payer: BC Managed Care – PPO | Admitting: Anesthesiology

## 2022-01-27 ENCOUNTER — Ambulatory Visit
Admission: RE | Admit: 2022-01-27 | Discharge: 2022-01-27 | Disposition: A | Discharge status: Home / Self Care | Payer: BC Managed Care – PPO | Attending: Gastroenterology | Admitting: Gastroenterology

## 2022-01-27 ENCOUNTER — Encounter
Admission: RE | Disposition: A | Discharge status: Home / Self Care | Payer: Self-pay | Source: Home / Self Care | Attending: Gastroenterology

## 2022-01-27 ENCOUNTER — Encounter: Payer: Self-pay | Admitting: Gastroenterology

## 2022-01-27 DIAGNOSIS — I10 Essential (primary) hypertension: Secondary | ICD-10-CM | POA: Diagnosis not present

## 2022-01-27 DIAGNOSIS — Z7984 Long term (current) use of oral hypoglycemic drugs: Secondary | ICD-10-CM | POA: Insufficient documentation

## 2022-01-27 DIAGNOSIS — E119 Type 2 diabetes mellitus without complications: Secondary | ICD-10-CM | POA: Insufficient documentation

## 2022-01-27 DIAGNOSIS — Z1211 Encounter for screening for malignant neoplasm of colon: Secondary | ICD-10-CM | POA: Diagnosis not present

## 2022-01-27 DIAGNOSIS — E785 Hyperlipidemia, unspecified: Secondary | ICD-10-CM | POA: Insufficient documentation

## 2022-01-27 DIAGNOSIS — Z79899 Other long term (current) drug therapy: Secondary | ICD-10-CM | POA: Insufficient documentation

## 2022-01-27 DIAGNOSIS — D122 Benign neoplasm of ascending colon: Secondary | ICD-10-CM | POA: Insufficient documentation

## 2022-01-27 DIAGNOSIS — K635 Polyp of colon: Secondary | ICD-10-CM | POA: Diagnosis not present

## 2022-01-27 HISTORY — PX: COLONOSCOPY WITH PROPOFOL: SHX5780

## 2022-01-27 LAB — GLUCOSE, CAPILLARY: Glucose-Capillary: 127 mg/dL — ABNORMAL HIGH (ref 70–99)

## 2022-01-27 SURGERY — COLONOSCOPY WITH PROPOFOL
Anesthesia: General

## 2022-01-27 MED ORDER — LIDOCAINE HCL (CARDIAC) PF 100 MG/5ML IV SOSY
PREFILLED_SYRINGE | INTRAVENOUS | Status: DC | PRN
  Administered 2022-01-27: 50 mg via INTRAVENOUS

## 2022-01-27 MED ORDER — PROPOFOL 10 MG/ML IV BOLUS
INTRAVENOUS | Status: DC | PRN
  Administered 2022-01-27: 70 mg via INTRAVENOUS

## 2022-01-27 MED ORDER — PROPOFOL 1000 MG/100ML IV EMUL
INTRAVENOUS | Status: AC
  Filled 2022-01-27: qty 100

## 2022-01-27 MED ORDER — PROPOFOL 500 MG/50ML IV EMUL
INTRAVENOUS | Status: DC | PRN
  Administered 2022-01-27: 140 ug/kg/min via INTRAVENOUS

## 2022-01-27 MED ORDER — SODIUM CHLORIDE 0.9 % IV SOLN: INTRAVENOUS | Status: DC

## 2022-01-27 NOTE — Anesthesia Procedure Notes (Signed)
Date/Time: 01/27/2022 7:42 AM  Performed by: Lily Peer, Violette Morneault, CRNAPre-anesthesia Checklist: Patient identified, Emergency Drugs available, Patient being monitored, Suction available and Timeout performed Patient Re-evaluated:Patient Re-evaluated prior to induction Oxygen Delivery Method: Nasal cannula Induction Type: IV induction

## 2022-01-27 NOTE — H&P (Signed)
Bryan Bellows, MD [address] [place], [place], [place], [address] [address], [place], [place], [place], [address] Phone: [phone]  Fax: [phone]  Primary Care Physician:  Tonia Ghent, MD   Pre-Procedure History & Physical: HPI:  Bryan Doyle is a 51 y.o. male is here for an colonoscopy.   Past Medical History:  Diagnosis Date   Allergy    Asthma    exercise induced - as child. no current issues   Diabetes mellitus    Fatty liver    Gout    Headache    sinus   Hyperlipidemia    Hypertension     Past Surgical History:  Procedure Laterality Date   ETHMOIDECTOMY Right 05/16/2015   Procedure: ETHMOIDECTOMY- TOTAL;  Surgeon: Carloyn Manner, MD;  Location: [facility];  Service: ENT;  Laterality: Right;   HERNIA REPAIR     bilateral    IMAGE GUIDED SINUS SURGERY N/A 05/16/2015   Procedure: IMAGE GUIDED SINUS SURGERY;  Surgeon: Carloyn Manner, MD;  Location: [facility];  Service: ENT;  Laterality: N/A;  GAVE DISK TO [facility] Diabetic - oral meds   MAXILLARY ANTROSTOMY Bilateral 05/16/2015   Procedure: MAXILLARY ANTROSTOMY;  Surgeon: Carloyn Manner, MD;  Location: [facility];  Service: ENT;  Laterality: Bilateral;   SEPTOPLASTY N/A 05/16/2015   Procedure: SEPTOPLASTY;  Surgeon: Carloyn Manner, MD;  Location: [facility];  Service: ENT;  Laterality: N/A;   TURBINATE REDUCTION Bilateral 05/16/2015   Procedure: INFERIOR TURBINATE REDUCTION;  Surgeon: Carloyn Manner, MD;  Location: [facility];  Service: ENT;  Laterality: Bilateral;   UMBILICAL HERNIA REPAIR N/A 04/20/2019   Procedure: HERNIA REPAIR UMBILICAL ADULT, OPEN;  Surgeon: Fredirick Maudlin, MD;  Location: [facility];  Service: General;  Laterality: N/A;   VASECTOMY  2013   Dr. Jacqlyn Larsen    Prior to Admission medications   Medication Sig Start Date End Date Taking? Authorizing Provider  lisinopril (ZESTRIL) 2.5 MG tablet TAKE 1 TABLET(2.5 MG) BY MOUTH  DAILY 11/05/21  Yes Tonia Ghent, MD  metFORMIN (GLUCOPHAGE) 500 MG tablet Take 1 tablet (500 mg total) by mouth 2 (two) times daily with a meal. 03/12/21  Yes Tonia Ghent, MD  Cholecalciferol (VITAMIN D) 125 MCG (5000 UT) CAPS Take 1 tablet by mouth.    [provider]  FREESTYLE LITE test strip TEST BLOOD SUGAR ONCE A DAY AND AS DIRECTED 12/12/16   Tonia Ghent, MD  indomethacin (INDOCIN) 50 MG capsule Take 1 capsule (50 mg total) by mouth 3 (three) times daily as needed (with food). 09/28/17   Tonia Ghent, MD  Lancets (FREESTYLE) lancets Check blood sugar once daily and as directed. Dx E11.9 08/03/15   Tonia Ghent, MD  loratadine (CLARITIN) 10 MG tablet Take 10 mg by mouth daily.    [provider]  pravastatin (PRAVACHOL) 10 MG tablet TAKE 1 TABLET(10 MG) BY MOUTH DAILY 08/09/21   Tonia Ghent, MD  triamcinolone cream (KENALOG) 0.1 % Apply 1 application topically 2 (two) times daily as needed. 06/13/19   Tonia Ghent, MD    Allergies as of 11/22/2021 - Review Complete 11/21/2021  Allergen Reaction Noted   Penicillins     Sulfa antibiotics Rash 06/05/2011    Family History  Problem Relation Age of Onset   COPD Mother    Cancer Mother        breast   Diabetes Father  type 1   Hyperlipidemia Father    Hypertension Father    Cancer Paternal Aunt    Diabetes Maternal Grandfather    Prostate cancer Maternal Grandfather    Colon cancer Neg Hx     Social History   Socioeconomic History   Marital status: Married    Spouse name: Not on file   Number of children: 1   Years of education: Not on file   Highest education level: Not on file  Occupational History   Occupation: [place]    Employer: [employer]  Tobacco Use   Smoking status: Never   Smokeless tobacco: Never  Vaping Use   Vaping Use: Never used  Substance and Sexual Activity   Alcohol use: Yes    Alcohol/week: 0.0 standard drinks of alcohol     Comment: rarely   Drug use: No   Sexual activity: Yes  Other Topics Concern   Not on file  Social History Narrative   Works at [employer], 2006   1 daughter born 2010   Social Determinants of Health   Financial Resource Strain: Not on [employer] Insecurity: Not on file  Transportation Needs: Not on file  Physical Activity: Not on file  Stress: Not on file  Social Connections: Not on file  Intimate Partner Violence: Not on file    Review of Systems: See HPI, otherwise negative ROS  Physical Exam: BP (!) 144/96   Pulse 74   Temp (!) 97.4 F (36.3 C) (Temporal)   Resp 18   Ht '5\' 7"'$  (1.702 m)   Wt 93 kg   SpO2 97%   BMI 32.11 kg/m  General:   Alert,  pleasant and cooperative in NAD Head:  Normocephalic and atraumatic. Neck:  Supple; no masses or thyromegaly. Lungs:  Clear throughout to auscultation, normal respiratory effort.    Heart:  +S1, +S2, Regular rate and rhythm, No edema. Abdomen:  Soft, nontender and nondistended. Normal bowel sounds, without guarding, and without rebound.   Neurologic:  Alert and  oriented x4;  grossly normal neurologically.  Impression/Plan: Bryan Doyle is here for an colonoscopy to be performed for Screening colonoscopy average risk   Risks, benefits, limitations, and alternatives regarding  colonoscopy have been reviewed with the patient.  Questions have been answered.  All parties agreeable.   Bryan Bellows, MD  01/27/2022, 7:38 AM

## 2022-01-27 NOTE — Op Note (Signed)
[facility]astroenterology Patient Name: Bryan Doyle Procedure Date: 01/27/2022 7:21 AM MRN: [id number] Account #: [id number] Date of Birth: Sep 03, 1970 Admit Type: Outpatient Age: 51 Room: [facility] ENDO ROOM 1 Gender: Male Note Status: Finalized Instrument Name: Park Meo [id number] Procedure:             Colonoscopy Indications:           Screening for colorectal malignant neoplasm Providers:             Jonathon Bellows MD, MD Medicines:             Monitored Anesthesia Care Complications:         No immediate complications. Procedure:             Pre-Anesthesia Assessment:                        - Prior to the procedure, a History and Physical was                         performed, and patient medications, allergies and                         sensitivities were reviewed. The patient's tolerance                         of previous anesthesia was reviewed.                        - The risks and benefits of the procedure and the                         sedation options and risks were discussed with the                         patient. All questions were answered and informed                         consent was obtained.                        - ASA Grade Assessment: II - A patient with mild                         systemic disease.                        After obtaining informed consent, the colonoscope was                         passed under direct vision. Throughout the procedure,                         the patient's blood pressure, pulse, and oxygen                         saturations were monitored continuously. The                         Colonoscope was introduced through the anus and  advanced to the the cecum, identified by the                         appendiceal orifice. The colonoscopy was performed                         with ease. The patient tolerated the procedure well.                         The quality of the bowel  preparation was excellent. Findings:      The perianal and digital rectal examinations were normal.      Three sessile polyps were found in the ascending colon. The polyps were       4 to 6 mm in size. These polyps were removed with a cold snare.       Resection and retrieval were complete.      A 3 mm polyp was found in the ascending colon. The polyp was sessile.       The polyp was removed with a jumbo cold forceps. Resection and retrieval       were complete.      The exam was otherwise without abnormality on direct and retroflexion       views. Impression:            - Three 4 to 6 mm polyps in the ascending colon,                         removed with a cold snare. Resected and retrieved.                        - One 3 mm polyp in the ascending colon, removed with                         a jumbo cold forceps. Resected and retrieved.                        - The examination was otherwise normal on direct and                         retroflexion views. Recommendation:        - Discharge patient to home (with escort).                        - Resume previous diet.                        - Continue present medications.                        - Await pathology results.                        - Repeat colonoscopy for surveillance based on                         pathology results. Procedure Code(s):     --- Professional ---                        [phone], Colonoscopy, flexible; with removal of  tumor(s), polyp(s), or other lesion(s) by snare                         technique                        [id number], 72, Colonoscopy, flexible; with biopsy, single                         or multiple Diagnosis Code(s):     --- Professional ---                        K63.5, Polyp of colon                        Z12.11, Encounter for screening for malignant neoplasm                         of colon CPT copyright 2019 American Medical Association. All rights reserved. The codes  documented in this report are preliminary and upon coder review may  be revised to meet current compliance requirements. Jonathon Bellows, MD Jonathon Bellows MD, MD 01/27/2022 8:00:05 AM This report has been signed electronically. Number of Addenda: 0 Note Initiated On: 01/27/2022 7:21 AM Scope Withdrawal Time: 0 hours 12 minutes 11 seconds  Total Procedure Duration: 0 hours 13 minutes 38 seconds  Estimated Blood Loss:  Estimated blood loss: none.      [facility]

## 2022-01-27 NOTE — Anesthesia Postprocedure Evaluation (Signed)
Anesthesia Post Note  Patient: Bryan Doyle  Procedure(s) Performed: COLONOSCOPY WITH PROPOFOL  Patient location during evaluation: Endoscopy Anesthesia Type: General Level of consciousness: awake and alert Pain management: pain level controlled Vital Signs Assessment: post-procedure vital signs reviewed and stable Respiratory status: spontaneous breathing, nonlabored ventilation, respiratory function stable and patient connected to nasal cannula oxygen Cardiovascular status: blood pressure returned to baseline and stable Postop Assessment: no apparent nausea or vomiting Anesthetic complications: no   No notable events documented.   Last Vitals:  Vitals:   01/27/22 0812 01/27/22 0822  BP: 114/66 140/83  Pulse: 69 64  Resp: 14 17  Temp:    SpO2: 100% 97%    Last Pain:  Vitals:   01/27/22 0822  TempSrc:   PainSc: 0-No pain                 Arita Miss

## 2022-01-27 NOTE — Anesthesia Preprocedure Evaluation (Signed)
Anesthesia Evaluation  Patient identified by MRN, date of birth, ID band Patient awake    Reviewed: Allergy & Precautions, NPO status , Patient's Chart, lab work & pertinent test results  History of Anesthesia Complications Negative for: history of anesthetic complications  Airway Mallampati: II  TM Distance: >3 FB Neck ROM: Full    Dental no notable dental hx. (+) Teeth Intact   Pulmonary neg sleep apnea, neg COPD, Patient abstained from smoking.Not current smoker,    Pulmonary exam normal breath sounds clear to auscultation       Cardiovascular Exercise Tolerance: Good METShypertension, Pt. on medications (-) CAD and (-) Past MI (-) dysrhythmias  Rhythm:Regular Rate:Normal - Systolic murmurs    Neuro/Psych  Headaches, negative psych ROS   GI/Hepatic neg GERD  ,(+)     (-) substance abuse  ,   Endo/Other  diabetes, Well Controlled, Oral Hypoglycemic Agents  Renal/GU negative Renal ROS     Musculoskeletal   Abdominal   Peds  Hematology   Anesthesia Other Findings Past Medical History: No date: Allergy No date: Asthma     Comment:  exercise induced - as child. no current issues No date: Diabetes mellitus No date: Fatty liver No date: Gout No date: Headache     Comment:  sinus No date: Hyperlipidemia No date: Hypertension  Reproductive/Obstetrics                             Anesthesia Physical Anesthesia Plan  ASA: 2  Anesthesia Plan: General   Post-op Pain Management: Minimal or no pain anticipated   Induction: Intravenous  PONV Risk Score and Plan: 2 and Propofol infusion, TIVA and Ondansetron  Airway Management Planned: Nasal Cannula  Additional Equipment: None  Intra-op Plan:   Post-operative Plan:   Informed Consent: I have reviewed the patients History and Physical, chart, labs and discussed the procedure including the risks, benefits and alternatives for the  proposed anesthesia with the patient or authorized representative who has indicated his/her understanding and acceptance.     Dental advisory given  Plan Discussed with: CRNA and Surgeon  Anesthesia Plan Comments: (Discussed risks of anesthesia with patient, including possibility of difficulty with spontaneous ventilation under anesthesia necessitating airway intervention, PONV, and rare risks such as cardiac or respiratory or neurological events, and allergic reactions. Discussed the role of CRNA in patient's perioperative care. Patient understands.)        Anesthesia Quick Evaluation

## 2022-01-27 NOTE — Transfer of Care (Addendum)
Immediate Anesthesia Transfer of Care Note  Patient: Bryan Doyle  Procedure(s) Performed: COLONOSCOPY WITH PROPOFOL  Patient Location: Endoscopy Unit  Anesthesia Type:General  Level of Consciousness: drowsy  Airway & Oxygen Therapy: Patient Spontanous Breathing  Post-op Assessment: Report given to RN and Post -op Vital signs reviewed and stable  Post vital signs: Reviewed and stable  Last Vitals:  Vitals Value Taken Time  BP 104/55   Temp    Pulse 72 01/27/22 0801  Resp 15 01/27/22 0801  SpO2 96 % 01/27/22 0801  Vitals shown include unvalidated device data.  Last Pain:  Vitals:   01/27/22 0702  TempSrc: Temporal  PainSc: 0-No pain         Complications: No notable events documented.

## 2022-01-28 ENCOUNTER — Encounter: Payer: Self-pay | Admitting: Gastroenterology

## 2022-01-28 LAB — SURGICAL PATHOLOGY

## 2022-02-06 ENCOUNTER — Other Ambulatory Visit: Payer: Self-pay | Admitting: Family Medicine

## 2022-03-04 ENCOUNTER — Other Ambulatory Visit: Payer: Self-pay | Admitting: Family Medicine

## 2022-05-01 ENCOUNTER — Other Ambulatory Visit: Payer: Self-pay | Admitting: Family Medicine

## 2022-05-26 ENCOUNTER — Ambulatory Visit: Payer: BC Managed Care – PPO | Admitting: Family Medicine

## 2022-06-09 ENCOUNTER — Ambulatory Visit (INDEPENDENT_AMBULATORY_CARE_PROVIDER_SITE_OTHER): Payer: BC Managed Care – PPO | Admitting: Family Medicine

## 2022-06-09 ENCOUNTER — Encounter: Payer: Self-pay | Admitting: Family Medicine

## 2022-06-09 VITALS — BP 122/84 | HR 71 | Temp 97.4°F | Ht 67.0 in | Wt 214.0 lb

## 2022-06-09 DIAGNOSIS — Z23 Encounter for immunization: Secondary | ICD-10-CM

## 2022-06-09 DIAGNOSIS — Z0000 Encounter for general adult medical examination without abnormal findings: Secondary | ICD-10-CM

## 2022-06-09 DIAGNOSIS — E119 Type 2 diabetes mellitus without complications: Secondary | ICD-10-CM

## 2022-06-09 DIAGNOSIS — Z125 Encounter for screening for malignant neoplasm of prostate: Secondary | ICD-10-CM

## 2022-06-09 DIAGNOSIS — Z7189 Other specified counseling: Secondary | ICD-10-CM

## 2022-06-09 LAB — LIPID PANEL
Cholesterol: 128 mg/dL (ref 0–200)
HDL: 30.1 mg/dL — ABNORMAL LOW (ref >39.00)
LDL Cholesterol: 73 mg/dL (ref 0–99)
NonHDL: 98.14
Total CHOL/HDL Ratio: 4
Triglycerides: 127 mg/dL (ref 0.0–149.0)
VLDL: 25.4 mg/dL (ref 0.0–40.0)

## 2022-06-09 LAB — COMPREHENSIVE METABOLIC PANEL
ALT: 73 U/L — ABNORMAL HIGH (ref 0–53)
AST: 45 U/L — ABNORMAL HIGH (ref 0–37)
Albumin: 4.1 g/dL (ref 3.5–5.2)
Alkaline Phosphatase: 86 U/L (ref 39–117)
BUN: 12 mg/dL (ref 6–23)
CO2: 31 mEq/L (ref 19–32)
Calcium: 9.4 mg/dL (ref 8.4–10.5)
Chloride: 102 mEq/L (ref 96–112)
Creatinine, Ser: 0.93 mg/dL (ref 0.40–1.50)
GFR: 95.56 mL/min (ref >60.00)
Glucose, Bld: 135 mg/dL — ABNORMAL HIGH (ref 70–99)
Potassium: 4.1 mEq/L (ref 3.5–5.1)
Sodium: 139 mEq/L (ref 135–145)
Total Bilirubin: 0.7 mg/dL (ref 0.2–1.2)
Total Protein: 6.4 g/dL (ref 6.0–8.3)

## 2022-06-09 LAB — PSA: PSA: 0.81 ng/mL (ref 0.10–4.00)

## 2022-06-09 LAB — HEMOGLOBIN A1C: Hgb A1c MFr Bld: 7.9 % — ABNORMAL HIGH (ref 4.6–6.5)

## 2022-06-09 LAB — URIC ACID: Uric Acid, Serum: 6.5 mg/dL (ref 4.0–7.8)

## 2022-06-09 MED ORDER — PRAVASTATIN SODIUM 10 MG PO TABS
ORAL_TABLET | ORAL | 3 refills | Status: DC
Start: 2022-06-09 — End: 2023-06-01

## 2022-06-09 MED ORDER — LISINOPRIL 2.5 MG PO TABS
ORAL_TABLET | ORAL | 3 refills | Status: DC
Start: 2022-06-09 — End: 2023-06-01

## 2022-06-09 NOTE — Progress Notes (Signed)
CPE- See plan.  Routine anticipatory guidance given to patient.  See health maintenance.  The possibility exists that previously documented standard health maintenance information may have been brought forward from a previous encounter into this note.  If needed, that same information has been updated to reflect the current situation based on today's encounter.    Tetanus 2020 Flu 2023 PNA up to date Shingles d/w pt.   covid vaccine prev done 2023 HIV screening prev done.  PSA pending 2022 Colonoscopy 2023 Living will d/w pt.  Wife designated if patient were incapacitated.   Diet and exercise d/w pt.  HCV screening deferred, this is reasonable.    Diabetes:  Using medications without difficulties: yes Hypoglycemic episodes: no Hyperglycemic episodes: no Feet problems: no Blood Sugars averaging: 110-150 eye exam within last year: yes Labs pending.    No recent gout flares.   Daughter dx'd with ADD, d/w pt.    PMH and SH reviewed Meds, vitals, and allergies reviewed.   ROS: Per HPI.  Unless specifically indicated otherwise in HPI, the patient denies:  General: fever. Eyes: acute vision changes ENT: sore throat Cardiovascular: chest pain Respiratory: SOB GI: vomiting GU: dysuria Musculoskeletal: acute back pain Derm: acute rash Neuro: acute motor dysfunction Psych: worsening mood Endocrine: polydipsia Heme: bleeding Allergy: hayfever  GEN: nad, alert and oriented HEENT: mucous membranes moist NECK: supple w/o LA CV: rrr. PULM: ctab, no inc wob ABD: soft, +bs EXT: no edema SKIN: no acute rash  Diabetic foot exam: Normal inspection No skin breakdown No calluses  Normal DP pulses Normal sensation to light touch and monofilament Nails normal

## 2022-06-09 NOTE — Patient Instructions (Signed)
Go to the lab on the way out.   If you have mychart we'll likely use that to update you.    Take care.  Glad to see you. Check with your insurance to see if they will cover the shingles shot. Recheck in about 6 months.  Sooner if needed.

## 2022-06-11 NOTE — Assessment & Plan Note (Signed)
Tetanus 2020 Flu 2023 PNA up to date Shingles d/w pt.   covid vaccine prev done 2023 HIV screening prev done.  PSA pending 2022 Colonoscopy 2023 Living will d/w pt.  Wife designated if patient were incapacitated.   Diet and exercise d/w pt.  HCV screening deferred, this is reasonable.

## 2022-06-11 NOTE — Assessment & Plan Note (Signed)
Continue work on diet and exercise.  Continue lisinopril metformin and pravastatin.  See notes on labs.  Recheck periodically.

## 2022-06-11 NOTE — Assessment & Plan Note (Signed)
Living will d/w pt.  Wife designated if patient were incapacitated.   ?

## 2022-06-12 ENCOUNTER — Other Ambulatory Visit: Payer: Self-pay | Admitting: Family Medicine

## 2022-06-12 MED ORDER — METFORMIN HCL 500 MG PO TABS: 500.0000 mg | ORAL_TABLET | Freq: Two times a day (BID) | ORAL | 3 refills | Status: DC

## 2022-08-04 ENCOUNTER — Telehealth: Payer: Self-pay

## 2022-08-04 ENCOUNTER — Ambulatory Visit (INDEPENDENT_AMBULATORY_CARE_PROVIDER_SITE_OTHER): Payer: BC Managed Care – PPO | Admitting: Family Medicine

## 2022-08-04 ENCOUNTER — Encounter: Payer: Self-pay | Admitting: Family Medicine

## 2022-08-04 VITALS — BP 140/86 | HR 73 | Temp 97.8°F | Ht 67.0 in | Wt 212.4 lb

## 2022-08-04 DIAGNOSIS — J069 Acute upper respiratory infection, unspecified: Secondary | ICD-10-CM | POA: Diagnosis not present

## 2022-08-04 NOTE — Progress Notes (Signed)
    Bryan Galyean T. Tawona Filsinger, MD, [place] [facility] [place], [address]  Phone: [phone]  FAX: [place] - 52 y.o. male  MRN [id number]  Date of Birth: Jan 25, 1971  Date: 08/04/2022  PCP: Tonia Ghent, MD  Referral: Tonia Ghent, MD  Chief Complaint  Patient presents with   Sore Throat    Has gotten better-Symptoms started Thursday night 2 Negative Covid test-Saturday and Sunday   Cough   Sinus Pressure   Conjunctivitis   Subjective:   This 52 y.o. male patient presents with runny nose, sneezing, cough, sore throat, malaise and minimal / low-grade fever .   Sore throat, sinus pressure and pain.   No underlying lung disease.  Non-smoker  No fever Lack of appetite all weekend.  Trying to drink as much water as possible.  Some eye discharge.   ? recent exposure to others with similar symptoms.   The patent denies sore throat as the primary complaint. Denies sthortness of breath/wheezing, high fever, chest pain, rhinits for more than 14 days, significant myalgia, otalgia, facial pain, abdominal pain, changes in bowel or bladder.   Review of Systems is noted in the HPI, as appropriate  Objective:   Blood pressure (!) 140/86, pulse 73, temperature 97.8 F (36.6 C), temperature source Temporal, height '5\' 7"'$  (1.702 m), weight 212 lb 6 oz (96.3 kg), SpO2 100 %.   Gen: WDWN, NAD. Globally Non-toxic HEENT: Throat clear, w/o exudate, R TM clear, L TM - good landmarks, No fluid present. rhinnorhea.  MMM Frontal sinuses: NT Max sinuses: NT NECK: Anterior cervical  LAD is absent CV: RRR, No M/G/R, cap refill <2 sec PULM: Breathing comfortably in no respiratory distress. no wheezing, crackles, rhonchi   Objective Data:   Assessment and Plan:     ICD-10-CM   1. Viral URI  J06.9       Supportive care reviewed with patient. See patient instruction section.  Follow-up: prn  only  Signed,  Kylah Maresh T. Octavis Sheeler, MD   Patient's Medications  New Prescriptions   No medications on file  Previous Medications   CHOLECALCIFEROL (VITAMIN D) 125 MCG (5000 UT) CAPS    Take 1 tablet by mouth.   FREESTYLE LITE TEST STRIP    TEST BLOOD SUGAR ONCE A DAY AND AS DIRECTED   INDOMETHACIN (INDOCIN) 50 MG CAPSULE    Take 1 capsule (50 mg total) by mouth 3 (three) times daily as needed (with food).   LANCETS (FREESTYLE) LANCETS    Check blood sugar once daily and as directed. Dx E11.9   LISINOPRIL (ZESTRIL) 2.5 MG TABLET    TAKE 1 TABLET(2.5 MG) BY MOUTH DAILY   LORATADINE (CLARITIN) 10 MG TABLET    Take 10 mg by mouth daily.   METFORMIN (GLUCOPHAGE) 500 MG TABLET    Take 1-2 tablets (500-1,000 mg total) by mouth 2 (two) times daily with a meal.   PRAVASTATIN (PRAVACHOL) 10 MG TABLET    1 tab daily   TRIAMCINOLONE CREAM (KENALOG) 0.1 %    Apply 1 application topically 2 (two) times daily as needed.  Modified Medications   No medications on file  Discontinued Medications   No medications on file

## 2022-08-04 NOTE — Telephone Encounter (Signed)
Per chart review pt has already seen Dr Lorelei Pont today. Sending note to Dr Lorelei Pont as Juluis Rainier.    [facility] Night - Client TELEPHONE ADVICE RECORD [facility] Patient Name: Bryan Doyle [facility] Gender: Male DOB: 06/26/1971 Age: 52 Y 11 M 6 D Return Phone Number: [phone] (Primary), [phone] (Secondary) Address: City/ State/ Zip: [place]  [address] Client [place] Night - Client Client Site [place] - Night Provider Renford Dills - MD Contact Type Call Who Is Calling Patient / Member / Family / Caregiver Call Type Triage / Clinical Relationship To Patient Self Return Phone Number [phone] (Primary) Chief Complaint Eye Pus Or Discharge Reason for Call Symptomatic / Request for Bryan Doyle states he is feeling under the weather. Sore throat. cough, sinus pressure are the symptoms with eye discharge. Translation No Nurse Assessment Nurse: Kirk Ruths, RN, [facility] Date/Time (Eastern Time): 08/04/2022 8:03:39 AM Confirm and document reason for call. If symptomatic, describe symptoms. ---Caller states he is feeling under the weather with a sore throat no fever covid negative sinus pressure and headache eye discharge bilaterally that is clear cough drinknig fluidds no fever Does the patient have any new or worsening symptoms? ---Yes Will a triage be completed? ---Yes Related visit to physician within the last 2 weeks? ---No Does the PT have any chronic conditions? (i.e. diabetes, asthma, this includes High risk factors for pregnancy, etc.) ---Yes List chronic conditions. ---dm Is this a behavioral health or substance abuse call? ---No Guidelines Guideline Title Affirmed Question Affirmed Notes Nurse Date/Time (Eastern Time) Nasal Allergies (Hay Fever) [1] Taking antihistamines > 2 days AND [2] nasal allergy symptoms interfere with sleep, school, or  work Cabin crew, Therapist, sports, [facility] 08/04/2022 8:06:04 AM PLEASE NOTE: All timestamps contained within this report are represented as [place] Standard Time. CONFIDENTIALTY NOTICE: This fax transmission is intended only for the addressee. It contains information that is legally privileged, confidential or otherwise protected from use or disclosure. If you are not the intended recipient, you are strictly prohibited from reviewing, disclosing, copying using or disseminating any of this information or taking any action in reliance on or regarding this information. If you have received this fax in error, please notify us immediately by telephone so that we can arrange for its return to [place]. Phone: [phone], Toll-Free: [phone], Fax: [phone] Page: 2 of 2 Call Id: [id number] [place]. Time Eilene Ghazi Time) Disposition Final User 08/04/2022 8:08:36 AM SEE PCP WITHIN 3 DAYS Yes Kirk Ruths, RN, [facility] Final Disposition 08/04/2022 8:08:36 AM SEE PCP WITHIN 3 DAYS Yes Kirk Ruths, RN, [facility] Caller Disagree/Comply Comply Caller Understands Yes PreDisposition Call Doctor Care Advice Given Per Guideline SEE PCP WITHIN 3 DAYS: * You need to be seen within 2 or 3 days. [place] POLLEN OFF BODY DAILY: * Remove pollen from the body with hair washing and a shower. * This is especially important right before bedtime. ANTIHISTAMINE MEDICINES FOR HAY FEVER: CARE ADVICE given per Nasal Allergies (Hay Fever) (Adult) guideline. * You become worse * Symptoms aren't controlled in 2 days with continuous antihistamines. CALL BACK IF: Referrals REFERRED TO PCP OFFIC

## 2022-08-06 ENCOUNTER — Encounter: Payer: Self-pay | Admitting: Family Medicine

## 2022-08-07 MED ORDER — AZITHROMYCIN 250 MG PO TABS: ORAL_TABLET | ORAL | 0 refills | Status: AC

## 2022-08-23 ENCOUNTER — Ambulatory Visit
Admission: EM | Admit: 2022-08-23 | Discharge: 2022-08-23 | Disposition: A | Discharge status: Home / Self Care | Payer: BC Managed Care – PPO | Attending: Urgent Care | Admitting: Urgent Care

## 2022-08-23 DIAGNOSIS — R059 Cough, unspecified: Secondary | ICD-10-CM | POA: Diagnosis not present

## 2022-08-23 DIAGNOSIS — R6889 Other general symptoms and signs: Secondary | ICD-10-CM | POA: Insufficient documentation

## 2022-08-23 DIAGNOSIS — Z1152 Encounter for screening for COVID-19: Secondary | ICD-10-CM | POA: Diagnosis not present

## 2022-08-23 DIAGNOSIS — R509 Fever, unspecified: Secondary | ICD-10-CM | POA: Insufficient documentation

## 2022-08-23 MED ORDER — OSELTAMIVIR PHOSPHATE 75 MG PO CAPS: 75.0000 mg | ORAL_CAPSULE | Freq: Two times a day (BID) | ORAL | 0 refills | Status: DC

## 2022-08-23 NOTE — ED Provider Notes (Addendum)
[facility]    CSN: [id number] Arrival date & time: 08/23/22  1252      History   Chief Complaint Chief Complaint  Patient presents with   Fever   Cough   Generalized Body Aches    HPI Bryan Doyle is a 52 y.o. male.    Fever Associated symptoms: cough   Cough Associated symptoms: fever     Patient presents to urgent care with symptoms starting yesterday.  Endorses fever, body aches, cough.  Negative COVID test at home but requesting PCR.  Past Medical History:  Diagnosis Date   Allergy    Asthma    exercise induced - as child. no current issues   Diabetes mellitus    Fatty liver    Gout    Headache    sinus   Hyperlipidemia    Hypertension     Patient Active Problem List   Diagnosis Date Noted   Rash 06/13/2019   Fatty liver 10/05/2016   Advance care planning 03/24/2014   Plantar fasciitis 03/21/2012   Routine general medical examination at a health care facility 03/18/2012   History of gout 07/10/2011   ANKLE PAIN, LEFT 05/28/2009   Diabetes mellitus without complication ([place]) [id number]   HYPERTRIGLYCERIDEMIA 08/02/2007   Allergic rhinitis 08/02/2007    Past Surgical History:  Procedure Laterality Date   COLONOSCOPY WITH PROPOFOL N/A 01/27/2022   Procedure: COLONOSCOPY WITH PROPOFOL;  Surgeon: Jonathon Bellows, MD;  Location: [facility] ENDOSCOPY;  Service: Gastroenterology;  Laterality: N/A;   ETHMOIDECTOMY Right 05/16/2015   Procedure: ETHMOIDECTOMY- TOTAL;  Surgeon: Carloyn Manner, MD;  Location: [place];  Service: ENT;  Laterality: Right;   HERNIA REPAIR     bilateral    IMAGE GUIDED SINUS SURGERY N/A 05/16/2015   Procedure: IMAGE GUIDED SINUS SURGERY;  Surgeon: Carloyn Manner, MD;  Location: [facility];  Service: ENT;  Laterality: N/A;  GAVE DISK TO [facility] Diabetic - oral meds   MAXILLARY ANTROSTOMY Bilateral 05/16/2015   Procedure: MAXILLARY ANTROSTOMY;  Surgeon: Carloyn Manner, MD;  Location: [place];  Service: ENT;  Laterality: Bilateral;   SEPTOPLASTY N/A 05/16/2015   Procedure: SEPTOPLASTY;  Surgeon: Carloyn Manner, MD;  Location: [place];  Service: ENT;  Laterality: N/A;   TURBINATE REDUCTION Bilateral 05/16/2015   Procedure: INFERIOR TURBINATE REDUCTION;  Surgeon: Carloyn Manner, MD;  Location: [place] [facility];  Service: ENT;  Laterality: Bilateral;   UMBILICAL HERNIA REPAIR N/A 04/20/2019   Procedure: HERNIA REPAIR UMBILICAL ADULT, OPEN;  Surgeon: Fredirick Maudlin, MD;  Location: [facility];  Service: General;  Laterality: N/A;   VASECTOMY  2013   Dr. Jacqlyn Larsen       Home Medications    Prior to Admission medications   Medication Sig Start Date End Date Taking? Authorizing Provider  Cholecalciferol (VITAMIN D) 125 MCG (5000 UT) CAPS Take 1 tablet by mouth.    [provider]  FREESTYLE LITE test strip TEST BLOOD SUGAR ONCE A DAY AND AS DIRECTED 12/12/16   Tonia Ghent, MD  indomethacin (INDOCIN) 50 MG capsule Take 1 capsule (50 mg total) by mouth 3 (three) times daily as needed (with food). 09/28/17   Tonia Ghent, MD  Lancets (FREESTYLE) lancets Check blood sugar once daily and as directed. Dx E11.9 08/03/15   Tonia Ghent, MD  lisinopril (ZESTRIL) 2.5 MG tablet TAKE 1 TABLET(2.5 MG) BY MOUTH DAILY 06/09/22   Tonia Ghent, MD  loratadine (CLARITIN) 10 MG tablet Take  10 mg by mouth daily.    [provider]  metFORMIN (GLUCOPHAGE) 500 MG tablet Take 1-2 tablets (500-1,000 mg total) by mouth 2 (two) times daily with a meal. 06/12/22   Tonia Ghent, MD  pravastatin (PRAVACHOL) 10 MG tablet 1 tab daily 06/09/22   Tonia Ghent, MD  triamcinolone cream (KENALOG) 0.1 % Apply 1 application topically 2 (two) times daily as needed. 06/13/19   Tonia Ghent, MD    Family History Family History  Problem Relation Age of Onset   COPD Mother    Cancer Mother        breast   Diabetes Father        type 1   Hyperlipidemia Father     Hypertension Father    Cancer Paternal Aunt    Diabetes Maternal Grandfather    Prostate cancer Maternal Grandfather    Colon cancer Neg Hx     Social History Social History   Tobacco Use   Smoking status: Never   Smokeless tobacco: Never  Vaping Use   Vaping Use: Never used  Substance Use Topics   Alcohol use: Yes    Alcohol/week: 0.0 standard drinks of alcohol    Comment: rarely   Drug use: No     Allergies   Penicillins and Sulfa antibiotics   Review of Systems Review of Systems  Constitutional:  Positive for fever.  Respiratory:  Positive for cough.      Physical Exam Triage Vital Signs ED Triage Vitals [08/23/22 1347]  Enc Vitals Group     BP      Pulse      Resp      Temp      Temp src      SpO2      Weight      Height      Head Circumference      Peak Flow      Pain Score 0     Pain Loc      Pain Edu?      Excl. in [place]?    No data found.  Updated Vital Signs There were no vitals taken for this visit.  Visual Acuity Right Eye Distance:   Left Eye Distance:   Bilateral Distance:    Right Eye Near:   Left Eye Near:    Bilateral Near:     Physical Exam Vitals reviewed.  Constitutional:      Appearance: Normal appearance. He is ill-appearing.  HENT:     Nose: Congestion present.     Mouth/Throat:     Pharynx: No oropharyngeal exudate or posterior oropharyngeal erythema.  Cardiovascular:     Rate and Rhythm: Normal rate and regular rhythm.     Pulses: Normal pulses.     Heart sounds: Normal heart sounds.  Pulmonary:     Effort: Pulmonary effort is normal.     Breath sounds: Normal breath sounds.  Skin:    General: Skin is warm and dry.  Neurological:     General: No focal deficit present.     Mental Status: He is alert and oriented to person, place, and time.  Psychiatric:        Mood and Affect: Mood normal.        Behavior: Behavior normal.      UC Treatments / Results  Labs (all labs ordered are listed, but only  abnormal results are displayed) Labs Reviewed - No data to display  EKG   Radiology  No results found.  Procedures Procedures (including critical care time)  Medications Ordered in UC Medications - No data to display  Initial Impression / Assessment and Plan / UC Course  I have reviewed the triage vital signs and the nursing notes.  Pertinent labs & imaging results that were available during my care of the patient were reviewed by me and considered in my medical decision making (see chart for details).   Patient is febrile here without recent antipyretics. Satting well on room air. Overall is ill appearing, well hydrated, without respiratory distress. Pulmonary exam is unremarkable.  Lungs CTAB without wheezing, rhonchi, rales.  No pharyngeal erythema or peritonsillar exudates.  Patient's symptoms are consistent with an acute viral process.  Unable to test for flu but will treat presumptively for influenza whilst awaiting results of COVID swab.  Otherwise recommending use of OTC medication for symptom control.  Final Clinical Impressions(s) / UC Diagnoses   Final diagnoses:  None   Discharge Instructions   None    ED Prescriptions   None    PDMP not reviewed this encounter.   Rose Phi, FNP 08/23/22 1357    ImmordinoAnnie Main, [place] 08/23/22 1401

## 2022-08-23 NOTE — Discharge Instructions (Addendum)
You have been diagnosed with a viral upper respiratory infection based on your symptoms and exam. Viral illnesses cannot be treated with antibiotics - they are self limiting - and you should find your symptoms resolving within a few days. Get plenty of rest and non-caffeinated fluids. Watch for signs of dehydration including reduced urine output and dark colored urine.  We have performed a respiratory swab testing for COVID. I have prescribed Tamiflu, antiviral therapy for influenza A, based on a presumptive diagnosis of influenza.  Someone will contact you after results of your swab are available with instructions to continue or stop this medication. If the results of this testing are positive, someone will call you if you are eligible for any antiviral treatment.    We recommend you use over-the-counter medications for symptom control including acetaminophen (Tylenol), ibuprofen (Advil/Motrin) or naproxen (Aleve) for throat pain, fever, chills or body aches. You may combine use of acetaminophen and ibuprofen/naproxen if needed.  Some patients find an pain-relieving throat spray such as Chloraseptic to be effective.  Also recommend cold/cough medication containing a cough suppressant such as dextromethorphan, as needed. Please note that some cough medications are not recommended if you suffer from hypertension.    Saline mist spray is helpful for removing excess mucus from your nose.  Room humidifiers are helpful to ease breathing at night. I recommend guaifenesin (Mucinex) with plenty of water throughout the day to help thin and loosen mucus secretions in your respiratory passages.   If appropriate based upon your other medical problems, you might also find relief of nasal/sinus congestion symptoms by using a nasal decongestant such as fluticasone (Flonase ) or pseudoephedrine (Sudafed sinus).  You will need to obtain Sudafed from behind the pharmacist counter.  Speak to the pharmacist to verify that you  are not duplicating medications with other over-the-counter formulations that you may be using.

## 2022-08-23 NOTE — ED Triage Notes (Signed)
Patient presents to UC for fever, body aches, and cough since yesterday. Not taking any OTC meds. Negative covid test.

## 2022-08-24 LAB — SARS CORONAVIRUS 2 (TAT 6-24 HRS): SARS Coronavirus 2: NEGATIVE

## 2022-09-15 ENCOUNTER — Ambulatory Visit (INDEPENDENT_AMBULATORY_CARE_PROVIDER_SITE_OTHER): Payer: BC Managed Care – PPO | Admitting: Family Medicine

## 2022-09-15 ENCOUNTER — Encounter: Payer: Self-pay | Admitting: Family Medicine

## 2022-09-15 VITALS — BP 122/80 | HR 63 | Temp 97.9°F | Ht 67.0 in | Wt 215.0 lb

## 2022-09-15 DIAGNOSIS — R059 Cough, unspecified: Secondary | ICD-10-CM | POA: Diagnosis not present

## 2022-09-15 MED ORDER — ALBUTEROL SULFATE HFA 108 (90 BASE) MCG/ACT IN AERS: 1.0000 | INHALATION_SPRAY | Freq: Four times a day (QID) | RESPIRATORY_TRACT | 0 refills | Status: DC | PRN

## 2022-09-15 NOTE — Progress Notes (Unsigned)
Cough for 6 weeks.  On ACE.  Dry cough now, predates all of the illness below.  No wheeze.  No fevers, chills. No heartburn.  No dysphagia.  No hemoptysis.  Nonsmoker.  H/o exercise induced asthma as a teenager but no konwn issues with that in adulthood.  He doesn't feel unwell.  Already on Claritin, has history of springtime allergies.  He was prev treated for strep.  He was neg for covid on 08/23/22.  Was treated with tamiflu at that point.    Cough is slowly getting better.    Meds, vitals, and allergies reviewed.   ROS: Per HPI unless specifically indicated in ROS section   L TM SOM R TM WNL Nasal exam stuffy.  OP wnl.  Neck supple no LA Rrr Ctab Abd soft, not ttp

## 2022-09-15 NOTE — Patient Instructions (Signed)
Use albuterol if needed. If not helping, then consider adding on flonase.   If still not better, then stop lisinopril and update me.  Update me sooner if worse.  Take care.  Glad to see you.

## 2022-09-17 NOTE — Assessment & Plan Note (Signed)
Discussed options.  Discussed differential diagnosis, ACE cough, postinfectious cough, history of exercise-induced asthma noted.  No history of COPD.  Benign exam.  Lungs are clear.  Okay for outpatient follow-up.  Reasonable to use albuterol if needed. If not helping, then consider adding on flonase.   If still not better, then stop lisinopril and update me.  Update me sooner if worse.  Cough is slowly getting better making a send his cough less likely.

## 2022-10-07 ENCOUNTER — Other Ambulatory Visit: Payer: Self-pay | Admitting: Family Medicine

## 2022-10-15 DIAGNOSIS — H2513 Age-related nuclear cataract, bilateral: Secondary | ICD-10-CM | POA: Diagnosis not present

## 2022-10-15 DIAGNOSIS — Z0100 Encounter for examination of eyes and vision without abnormal findings: Secondary | ICD-10-CM | POA: Diagnosis not present

## 2022-10-15 DIAGNOSIS — E119 Type 2 diabetes mellitus without complications: Secondary | ICD-10-CM | POA: Diagnosis not present

## 2022-10-15 LAB — HM DIABETES EYE EXAM

## 2022-11-05 DIAGNOSIS — Z0000 Encounter for general adult medical examination without abnormal findings: Secondary | ICD-10-CM | POA: Diagnosis not present

## 2022-11-05 DIAGNOSIS — E785 Hyperlipidemia, unspecified: Secondary | ICD-10-CM | POA: Diagnosis not present

## 2022-11-05 DIAGNOSIS — Z8249 Family history of ischemic heart disease and other diseases of the circulatory system: Secondary | ICD-10-CM | POA: Diagnosis not present

## 2022-11-05 DIAGNOSIS — D500 Iron deficiency anemia secondary to blood loss (chronic): Secondary | ICD-10-CM | POA: Diagnosis not present

## 2022-11-14 ENCOUNTER — Encounter: Payer: Self-pay | Admitting: Family Medicine

## 2022-11-14 ENCOUNTER — Ambulatory Visit (INDEPENDENT_AMBULATORY_CARE_PROVIDER_SITE_OTHER): Payer: BC Managed Care – PPO | Admitting: Family Medicine

## 2022-11-14 VITALS — BP 118/80 | HR 72 | Temp 98.0°F | Ht 67.0 in | Wt 215.0 lb

## 2022-11-14 DIAGNOSIS — E119 Type 2 diabetes mellitus without complications: Secondary | ICD-10-CM

## 2022-11-14 DIAGNOSIS — Z7984 Long term (current) use of oral hypoglycemic drugs: Secondary | ICD-10-CM

## 2022-11-14 DIAGNOSIS — R21 Rash and other nonspecific skin eruption: Secondary | ICD-10-CM | POA: Diagnosis not present

## 2022-11-14 LAB — COMPREHENSIVE METABOLIC PANEL
ALT: 62 U/L — ABNORMAL HIGH (ref 0–53)
AST: 38 U/L — ABNORMAL HIGH (ref 0–37)
Albumin: 4.2 g/dL (ref 3.5–5.2)
Alkaline Phosphatase: 87 U/L (ref 39–117)
BUN: 12 mg/dL (ref 6–23)
CO2: 32 mEq/L (ref 19–32)
Calcium: 9.3 mg/dL (ref 8.4–10.5)
Chloride: 101 mEq/L (ref 96–112)
Creatinine, Ser: 0.94 mg/dL (ref 0.40–1.50)
GFR: 94.06 mL/min (ref >60.00)
Glucose, Bld: 126 mg/dL — ABNORMAL HIGH (ref 70–99)
Potassium: 3.9 mEq/L (ref 3.5–5.1)
Sodium: 140 mEq/L (ref 135–145)
Total Bilirubin: 0.7 mg/dL (ref 0.2–1.2)
Total Protein: 7 g/dL (ref 6.0–8.3)

## 2022-11-14 LAB — HEMOGLOBIN A1C: Hgb A1c MFr Bld: 7.6 % — ABNORMAL HIGH (ref 4.6–6.5)

## 2022-11-14 LAB — MICROALBUMIN / CREATININE URINE RATIO
Creatinine,U: 135.3 mg/dL
Microalb Creat Ratio: 0.6 mg/g (ref 0.0–30.0)
Microalb, Ur: 0.8 mg/dL (ref 0.0–1.9)

## 2022-11-14 MED ORDER — TRIAMCINOLONE ACETONIDE 0.1 % EX CREA: 1.0000 | TOPICAL_CREAM | Freq: Two times a day (BID) | CUTANEOUS | 1 refills | Status: AC | PRN

## 2022-11-14 NOTE — Patient Instructions (Addendum)
Go to the lab on the way out.   If you have mychart we'll likely use that to update you.    Take care.  Glad to see you. See if TAC helps the skin spot.  If not, let me know.  Plan on recheck in about 6 months at a physical.

## 2022-11-14 NOTE — Progress Notes (Unsigned)
Diabetes:  Using medications without difficulties: yes Hypoglycemic episodes:no Hyperglycemic episodes:no Feet problems:no Blood Sugars averaging: 120-140 eye exam within last year: yes Taking 3 metformin a day.  No ADE on med.    Rash on R shin.  1.5 x 2 reddish superficial lesion w/o ulceration.    Daughter going to camp [employer] this summer.    Meds, vitals, and allergies reviewed.   ROS: Per HPI unless specifically indicated in ROS section   GEN: nad, alert and oriented HEENT: mucous membranes moist NECK: supple w/o LA CV: rrr. PULM: ctab, no inc wob ABD: soft, +bs EXT: no edema SKIN: no acute rash but chronic lesion on the R shin. See above.    Labs pending.

## 2022-11-16 NOTE — Assessment & Plan Note (Signed)
Taking 3 metformin a day.  No ADE on med.  Discussed diet and exercise.  See notes on labs.  Recheck periodically.

## 2022-11-16 NOTE — Assessment & Plan Note (Signed)
Rash on R shin.  1.5 x 2 reddish superficial lesion w/o ulceration.   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  Reasonable to use triamcinolone with routine cautions and update me as needed.

## 2022-11-17 ENCOUNTER — Ambulatory Visit: Payer: BC Managed Care – PPO | Admitting: Family Medicine

## 2022-11-21 DIAGNOSIS — M545 Low back pain, unspecified: Secondary | ICD-10-CM | POA: Diagnosis not present

## 2022-11-24 ENCOUNTER — Ambulatory Visit: Payer: BC Managed Care – PPO | Admitting: Family Medicine

## 2022-12-05 DIAGNOSIS — U071 COVID-19: Secondary | ICD-10-CM | POA: Diagnosis not present

## 2023-06-01 ENCOUNTER — Encounter: Payer: Self-pay | Admitting: Family Medicine

## 2023-06-01 ENCOUNTER — Ambulatory Visit: Payer: BC Managed Care – PPO | Admitting: Family Medicine

## 2023-06-01 VITALS — BP 128/72 | HR 63 | Temp 97.7°F | Ht 68.75 in | Wt 218.2 lb

## 2023-06-01 DIAGNOSIS — Z8739 Personal history of other diseases of the musculoskeletal system and connective tissue: Secondary | ICD-10-CM

## 2023-06-01 DIAGNOSIS — Z125 Encounter for screening for malignant neoplasm of prostate: Secondary | ICD-10-CM

## 2023-06-01 DIAGNOSIS — Z0001 Encounter for general adult medical examination with abnormal findings: Secondary | ICD-10-CM

## 2023-06-01 DIAGNOSIS — Z23 Encounter for immunization: Secondary | ICD-10-CM | POA: Diagnosis not present

## 2023-06-01 DIAGNOSIS — E119 Type 2 diabetes mellitus without complications: Secondary | ICD-10-CM

## 2023-06-01 DIAGNOSIS — Z7189 Other specified counseling: Secondary | ICD-10-CM

## 2023-06-01 DIAGNOSIS — Z7984 Long term (current) use of oral hypoglycemic drugs: Secondary | ICD-10-CM

## 2023-06-01 DIAGNOSIS — Z0000 Encounter for general adult medical examination without abnormal findings: Secondary | ICD-10-CM

## 2023-06-01 DIAGNOSIS — E781 Pure hyperglyceridemia: Secondary | ICD-10-CM

## 2023-06-01 LAB — LIPID PANEL
Cholesterol: 140 mg/dL (ref 0–200)
HDL: 28 mg/dL — ABNORMAL LOW (ref >39.00)
LDL Cholesterol: 90 mg/dL (ref 0–99)
NonHDL: 111.81
Total CHOL/HDL Ratio: 5
Triglycerides: 111 mg/dL (ref 0.0–149.0)
VLDL: 22.2 mg/dL (ref 0.0–40.0)

## 2023-06-01 LAB — COMPREHENSIVE METABOLIC PANEL
ALT: 73 U/L — ABNORMAL HIGH (ref 0–53)
AST: 42 U/L — ABNORMAL HIGH (ref 0–37)
Albumin: 4.2 g/dL (ref 3.5–5.2)
Alkaline Phosphatase: 89 U/L (ref 39–117)
BUN: 12 mg/dL (ref 6–23)
CO2: 29 meq/L (ref 19–32)
Calcium: 9.1 mg/dL (ref 8.4–10.5)
Chloride: 99 meq/L (ref 96–112)
Creatinine, Ser: 0.92 mg/dL (ref 0.40–1.50)
GFR: 96.15 mL/min (ref >60.00)
Glucose, Bld: 154 mg/dL — ABNORMAL HIGH (ref 70–99)
Potassium: 4.3 meq/L (ref 3.5–5.1)
Sodium: 137 meq/L (ref 135–145)
Total Bilirubin: 0.8 mg/dL (ref 0.2–1.2)
Total Protein: 6.5 g/dL (ref 6.0–8.3)

## 2023-06-01 LAB — PSA: PSA: 0.71 ng/mL (ref 0.10–4.00)

## 2023-06-01 LAB — HEMOGLOBIN A1C: Hgb A1c MFr Bld: 8.1 % — ABNORMAL HIGH (ref 4.6–6.5)

## 2023-06-01 LAB — URIC ACID: Uric Acid, Serum: 5.7 mg/dL (ref 4.0–7.8)

## 2023-06-01 MED ORDER — LISINOPRIL 2.5 MG PO TABS: ORAL_TABLET | ORAL | 3 refills | Status: DC

## 2023-06-01 MED ORDER — PRAVASTATIN SODIUM 10 MG PO TABS: ORAL_TABLET | ORAL | 3 refills | Status: DC

## 2023-06-01 MED ORDER — METFORMIN HCL 500 MG PO TABS: 500.0000 mg | ORAL_TABLET | Freq: Two times a day (BID) | ORAL | 3 refills | Status: DC

## 2023-06-01 NOTE — Assessment & Plan Note (Signed)
Tetanus 2020 Flu 2024 PNA up to date Shingles d/w pt.   covid vaccine d/w pt  HIV screening prev done.  PSA pending 2024 Colonoscopy 2023 Living will d/w pt.  Wife designated if patient were incapacitated.   Diet and exercise d/w pt.

## 2023-06-01 NOTE — Assessment & Plan Note (Signed)
Living will d/w pt.  Wife designated if patient were incapacitated.   ?

## 2023-06-01 NOTE — Patient Instructions (Addendum)
Go to the lab on the way out.   If you have mychart we'll likely use that to update you.    Take care.  Glad to see you. Recheck in about 6 months with A1c at the visit.

## 2023-06-01 NOTE — Assessment & Plan Note (Signed)
No sx, recheck uric acid pending.

## 2023-06-01 NOTE — Progress Notes (Signed)
CPE- See plan.  Routine anticipatory guidance given to patient.  See health maintenance.  The possibility exists that previously documented standard health maintenance information may have been brought forward from a previous encounter into this note.  If needed, that same information has been updated to reflect the current situation based on today's encounter.    Tetanus 2020 Flu 2024 PNA up to date Shingles d/w pt.   covid vaccine d/w pt HIV screening prev done.  PSA pending 2024 Colonoscopy 2023 Living will d/w pt.  Wife designated if patient were incapacitated.   Diet and exercise d/w pt.   Elevated Cholesterol: Using medications without problems: yes Muscle aches: no Diet compliance: d/w pt.  Exercise: d/w pt.  Labs pending.   Diabetes:  Using medications without difficulties:yes Hypoglycemic episodes:no Hyperglycemic episodes: no Feet problems:no Blood Sugars averaging: 115-140 eye exam within last year:yes Metformin 2 tabs AM and 1 tab PM.   Labs pending.   Gout- no recent flares.  No recent indomethacin.  Labs pending. He cut out scallops and that helped.    PMH and SH reviewed  Meds, vitals, and allergies reviewed.   ROS: Per HPI.  Unless specifically indicated otherwise in HPI, the patient denies:  General: fever. Eyes: acute vision changes ENT: sore throat Cardiovascular: chest pain Respiratory: SOB GI: vomiting GU: dysuria Musculoskeletal: acute back pain Derm: acute rash Neuro: acute motor dysfunction Psych: worsening mood Endocrine: polydipsia Heme: bleeding Allergy: hayfever  GEN: nad, alert and oriented HEENT: ncat NECK: supple w/o LA CV: rrr. PULM: ctab, no inc wob ABD: soft, +bs, soft umbilical hernia noted- not ttp.   EXT: no edema SKIN: no acute rash  Diabetic foot exam: Normal inspection No skin breakdown No calluses  Normal DP pulses Normal sensation to light touch and monofilament Nails normal

## 2023-06-01 NOTE — Assessment & Plan Note (Signed)
See notes on labs. Continue work on diet and exercise.  Continue metformin. On ACE and statin.

## 2023-06-01 NOTE — Assessment & Plan Note (Signed)
See notes on labs. Continue work on diet and exercise.  Continue pravastatin.

## 2023-06-07 ENCOUNTER — Other Ambulatory Visit: Payer: Self-pay | Admitting: Family Medicine

## 2023-06-07 MED ORDER — METFORMIN HCL 500 MG PO TABS: 1000.0000 mg | ORAL_TABLET | Freq: Two times a day (BID) | ORAL | Status: DC

## 2023-07-17 ENCOUNTER — Other Ambulatory Visit: Payer: Self-pay | Admitting: Family Medicine

## 2023-10-05 ENCOUNTER — Ambulatory Visit (INDEPENDENT_AMBULATORY_CARE_PROVIDER_SITE_OTHER): Admitting: Internal Medicine

## 2023-10-05 ENCOUNTER — Encounter: Payer: Self-pay | Admitting: Internal Medicine

## 2023-10-05 VITALS — BP 126/84 | HR 99 | Temp 98.6°F | Ht 68.75 in | Wt 208.0 lb

## 2023-10-05 DIAGNOSIS — J0100 Acute maxillary sinusitis, unspecified: Secondary | ICD-10-CM | POA: Insufficient documentation

## 2023-10-05 MED ORDER — AZITHROMYCIN 250 MG PO TABS: ORAL_TABLET | ORAL | 0 refills | Status: DC

## 2023-10-05 NOTE — Assessment & Plan Note (Signed)
 Symptoms could have started with allergies or viral infection Discussed analgesics Increase allergy regimen (consider adding nasal steroid) If worsens, can fill Rx for z-pak

## 2023-10-05 NOTE — Progress Notes (Signed)
 Subjective:    Patient ID: Bryan Doyle, male    DOB: May 10, 1971, 53 y.o.   MRN: [id number]  HPI Here due to respiratory symptoms  Thought it was just allergies--but now worse Started 3 days ago--with post nasal drainage Then voice off since 2 days ago--sore throat Headache/sinus pressure--maxillary and slight frontal Rhinorrhea, low grade fever in past 2 days Some sweats as fever broke Some cough--mostly dry No SOB No ear pain  Using advil cold and sinus--helps a little Uses claritin daily  Current Outpatient Medications on File Prior to Visit  Medication Sig Dispense Refill   albuterol (VENTOLIN HFA) 108 (90 Base) MCG/ACT inhaler INHALE 1 TO 2 PUFFS INTO THE LUNGS EVERY 6 HOURS AS NEEDED FOR COUGH 8.5 g 1   Cholecalciferol (VITAMIN D) 125 MCG (5000 UT) CAPS Take 1 tablet by mouth.     FREESTYLE LITE test strip TEST BLOOD SUGAR ONCE A DAY AND AS DIRECTED 100 each 3   indomethacin (INDOCIN) 50 MG capsule Take 1 capsule (50 mg total) by mouth 3 (three) times daily as needed (with food).     Lancets (FREESTYLE) lancets Check blood sugar once daily and as directed. Dx E11.9 100 each 3   lisinopril (ZESTRIL) 2.5 MG tablet TAKE 1 TABLET(2.5 MG) BY MOUTH DAILY 90 tablet 3   loratadine (CLARITIN) 10 MG tablet Take 10 mg by mouth daily.     metFORMIN (GLUCOPHAGE) 500 MG tablet TAKE 1 TO 2 TABLETS(500 TO 1000 MG) BY MOUTH TWICE DAILY WITH A MEAL 360 tablet 1   pravastatin (PRAVACHOL) 10 MG tablet 1 tab daily 90 tablet 3   triamcinolone cream (KENALOG) 0.1 % Apply 1 Application topically 2 (two) times daily as needed. 30 g 1   No current facility-administered medications on file prior to visit.    Allergies  Allergen Reactions   Penicillins     REACTION: u/k- was told in infancy to avoid   Sulfa Antibiotics Rash    Severe rash and fever    Past Medical History:  Diagnosis Date   Allergy    Asthma    exercise induced - as child. no current issues   Diabetes mellitus     Fatty liver    Gout    Headache    sinus   Hyperlipidemia    Hypertension     Past Surgical History:  Procedure Laterality Date   COLONOSCOPY WITH PROPOFOL N/A 01/27/2022   Procedure: COLONOSCOPY WITH PROPOFOL;  Surgeon: Wyline Mood, MD;  [facility] ENDOSCOPY;  Service: Gastroenterology;  Laterality: N/A;   ETHMOIDECTOMY Right 05/16/2015   Procedure: ETHMOIDECTOMY- TOTAL;  Surgeon: Bud Face, MD;  Location: [facility];  Service: ENT;  Laterality: Right;   HERNIA REPAIR     bilateral    IMAGE GUIDED SINUS SURGERY N/A 05/16/2015   Procedure: IMAGE GUIDED SINUS SURGERY;  Surgeon: Bud Face, MD;  Location: [facility];  Service: ENT;  Laterality: N/A;  GAVE DISK TO [facility] Diabetic - oral meds   MAXILLARY ANTROSTOMY Bilateral 05/16/2015   Procedure: MAXILLARY ANTROSTOMY;  Surgeon: Bud Face, MD;  Location: [facility];  Service: ENT;  Laterality: Bilateral;   SEPTOPLASTY N/A 05/16/2015   Procedure: SEPTOPLASTY;  Surgeon: Bud Face, MD;  Location: [facility];  Service: ENT;  Laterality: N/A;   TURBINATE REDUCTION Bilateral 05/16/2015   Procedure: INFERIOR TURBINATE REDUCTION;  Surgeon: Bud Face, MD;  Location: [facility];  Service: ENT;  Laterality: Bilateral;   UMBILICAL HERNIA REPAIR  N/A 04/20/2019   Procedure: HERNIA REPAIR UMBILICAL ADULT, OPEN;  Surgeon: Duanne Guess, MD;  Location: [facility] ORS;  Service: General;  Laterality: N/A;   VASECTOMY  2013   Dr. Achilles Dunk    Family History  Problem Relation Age of Onset   COPD Mother    Cancer Mother        breast   Diabetes Father        type 1   Hyperlipidemia Father    Hypertension Father    Cancer Paternal Aunt    Diabetes Maternal Grandfather    Prostate cancer Maternal Grandfather    Colon cancer Neg Hx     Social History   Socioeconomic History   Marital status: Married    Spouse name: Not on file   Number of children: 1   Years of education: Not  on file   Highest education level: Bachelor's degree (e.g., BA, AB, BS)  Occupational History   Occupation: Proofreader: [employer]  Tobacco Use   Smoking status: Never   Smokeless tobacco: Never  Vaping Use   Vaping status: Never Used  Substance and Sexual Activity   Alcohol use: Yes    Alcohol/week: 0.0 standard drinks of alcohol    Comment: rarely   Drug use: No   Sexual activity: Yes  Other Topics Concern   Not on file  Social History Narrative   Works at [employer], 2006   1 daughter born 2010   Social Drivers of Health   Financial Resource Strain: Low Risk  (11/12/2022)   Overall Financial Resource Strain (CARDIA)    Difficulty of Paying Living Expenses: Not hard at all  Food Insecurity: No Food Insecurity (11/12/2022)   Hunger Vital Sign    Worried About Running Out of Food in the Last Year: Never true    Ran Out of Food in the Last Year: Never true  Transportation Needs: No Transportation Needs (11/12/2022)   PRAPARE - Administrator, Civil Service (Medical): No    Lack of Transportation (Non-Medical): No  Physical Activity: Insufficiently Active (11/12/2022)   Exercise Vital Sign    Days of Exercise per Week: 1 day    Minutes of Exercise per Session: 30 min  Stress: No Stress Concern Present (11/12/2022)   [employer] of Occupational Health - Occupational Stress Questionnaire    Feeling of Stress : Only a little  Social Connections: Unknown (11/12/2022)   Social Connection and Isolation Panel [NHANES]    Frequency of Communication with Friends and Family: More than three times a week    Frequency of Social Gatherings with Friends and Family: Once a week    Attends Religious Services: Patient declined    Database administrator or Organizations: No    Attends Engineer, structural: Not on file    Marital Status: Married  Catering manager Violence: Not on file   Review of Systems No N/V Appetite is  off--but is able to eat/drink     Objective:   Physical Exam Constitutional:      Appearance: Normal appearance.  HENT:     Head:     Comments: No sinus tenderness    Right Ear: Tympanic membrane and ear canal normal.     Left Ear: Tympanic membrane and ear canal normal.     Mouth/Throat:     Pharynx: No oropharyngeal exudate or posterior oropharyngeal erythema.  Pulmonary:     Effort: Pulmonary  effort is normal.     Breath sounds: Normal breath sounds. No wheezing or rales.  Musculoskeletal:     Cervical back: Neck supple.  Lymphadenopathy:     Cervical: No cervical adenopathy.  Neurological:     Mental Status: He is alert.            Assessment & Plan:

## 2023-10-16 DIAGNOSIS — E119 Type 2 diabetes mellitus without complications: Secondary | ICD-10-CM | POA: Diagnosis not present

## 2023-10-16 DIAGNOSIS — H2512 Age-related nuclear cataract, left eye: Secondary | ICD-10-CM | POA: Diagnosis not present

## 2023-10-16 DIAGNOSIS — H2511 Age-related nuclear cataract, right eye: Secondary | ICD-10-CM | POA: Diagnosis not present

## 2023-10-16 DIAGNOSIS — H2513 Age-related nuclear cataract, bilateral: Secondary | ICD-10-CM | POA: Diagnosis not present

## 2023-10-16 LAB — HM DIABETES EYE EXAM

## 2023-11-23 ENCOUNTER — Ambulatory Visit: Payer: BC Managed Care – PPO | Admitting: Family Medicine

## 2023-11-24 ENCOUNTER — Ambulatory Visit: Admitting: Family Medicine

## 2023-11-27 ENCOUNTER — Ambulatory Visit (INDEPENDENT_AMBULATORY_CARE_PROVIDER_SITE_OTHER): Admitting: Family Medicine

## 2023-11-27 VITALS — BP 136/92 | HR 73 | Temp 98.1°F | Ht 68.0 in | Wt 215.0 lb

## 2023-11-27 DIAGNOSIS — E119 Type 2 diabetes mellitus without complications: Secondary | ICD-10-CM | POA: Diagnosis not present

## 2023-11-27 DIAGNOSIS — Z7985 Long-term (current) use of injectable non-insulin antidiabetic drugs: Secondary | ICD-10-CM

## 2023-11-27 DIAGNOSIS — J0100 Acute maxillary sinusitis, unspecified: Secondary | ICD-10-CM | POA: Diagnosis not present

## 2023-11-27 LAB — MICROALBUMIN / CREATININE URINE RATIO
Creatinine,U: 130.5 mg/dL
Microalb Creat Ratio: UNDETERMINED mg/g (ref 0.0–30.0)
Microalb, Ur: <0.7 mg/dL

## 2023-11-27 LAB — POCT GLYCOSYLATED HEMOGLOBIN (HGB A1C): Hemoglobin A1C: 7.3 % — AB (ref 4.0–5.6)

## 2023-11-27 MED ORDER — AZITHROMYCIN 250 MG PO TABS: ORAL_TABLET | ORAL | 0 refills | Status: DC

## 2023-11-27 MED ORDER — METFORMIN HCL 500 MG PO TABS: 1000.0000 mg | ORAL_TABLET | Freq: Two times a day (BID) | ORAL | Status: DC

## 2023-11-27 NOTE — Patient Instructions (Signed)
 Go to the lab on the way out.   If you have mychart we'll likely use that to update you.    Take care.  Glad to see you. Let me know if you want to get the cardiac screening done.  Recheck in about 6 months.  Yearly visit.  Labs ahead of time if possible.

## 2023-11-27 NOTE — Progress Notes (Unsigned)
 Diabetes:  Using medications without difficulties: yes Hypoglycemic episodes: no Hyperglycemic episodes:no Feet problems:no Blood Sugars averaging: 110-145.  eye exam within last year: yes A1c improved to 7.3.  d/w pt at OV.   D/w pt about ozempic or similar.  Pros and cons discussed with patient.  D/w pt about cardiac screening.  He'll consider and let me know if he wants to proceed.    Cold sx started last week.  No recent SABA sx but may use at night, some cough at night.  He thought he was getting some better but then has more nasal discharge this AM.  No fevers.  No vomiting, no diarrhea.  Facial pain, L max area this AM.  ST improved.    PMH and SH reviewed  Meds, vitals, and allergies reviewed.   ROS: Per HPI unless specifically indicated in ROS section   GEN: nad, alert and oriented HEENT: mucous membranes moist, nasal exam irritated.  TM wnl B. OP wnl.  NECK: supple w/o LA CV: rrr. PULM: ctab, no inc wob ABD: soft, +bs EXT: no edema SKIN: well perfused.   32 minutes were devoted to patient care in this encounter (this includes time spent reviewing the patient's file/history, interviewing and examining the patient, counseling/reviewing plan with patient).

## 2023-11-30 ENCOUNTER — Ambulatory Visit: Payer: Self-pay | Admitting: Family Medicine

## 2023-11-30 NOTE — Assessment & Plan Note (Signed)
 Presumed.  Prescription sent for azithromycin .  Routine cautions given to patient.  Update me as needed.

## 2023-11-30 NOTE — Assessment & Plan Note (Signed)
 A1c improved to 7.3.  Discussed with patient about diet and exercise. Recheck in about 6 months.  Yearly visit.  Labs ahead of time if possible.   See notes on microalbumin today.  Since his A1c is improving I would not change his medications at this point.  Already on statin and ACE.

## 2024-02-05 ENCOUNTER — Other Ambulatory Visit: Payer: Self-pay | Admitting: Family Medicine

## 2024-05-18 ENCOUNTER — Other Ambulatory Visit: Payer: Self-pay | Admitting: Family Medicine

## 2024-05-18 DIAGNOSIS — E119 Type 2 diabetes mellitus without complications: Secondary | ICD-10-CM

## 2024-05-18 DIAGNOSIS — Z125 Encounter for screening for malignant neoplasm of prostate: Secondary | ICD-10-CM

## 2024-05-18 DIAGNOSIS — Z8739 Personal history of other diseases of the musculoskeletal system and connective tissue: Secondary | ICD-10-CM

## 2024-05-26 ENCOUNTER — Other Ambulatory Visit

## 2024-05-26 DIAGNOSIS — Z8739 Personal history of other diseases of the musculoskeletal system and connective tissue: Secondary | ICD-10-CM | POA: Diagnosis not present

## 2024-05-26 DIAGNOSIS — E119 Type 2 diabetes mellitus without complications: Secondary | ICD-10-CM | POA: Diagnosis not present

## 2024-05-26 DIAGNOSIS — Z125 Encounter for screening for malignant neoplasm of prostate: Secondary | ICD-10-CM

## 2024-05-26 LAB — LIPID PANEL
Cholesterol: 118 mg/dL (ref 0–200)
HDL: 26.4 mg/dL — ABNORMAL LOW (ref >39.00)
LDL Cholesterol: 73 mg/dL (ref 0–99)
NonHDL: 91.98
Total CHOL/HDL Ratio: 4
Triglycerides: 97 mg/dL (ref 0.0–149.0)
VLDL: 19.4 mg/dL (ref 0.0–40.0)

## 2024-05-26 LAB — COMPREHENSIVE METABOLIC PANEL WITH GFR
ALT: 67 U/L — ABNORMAL HIGH (ref 0–53)
AST: 42 U/L — ABNORMAL HIGH (ref 0–37)
Albumin: 3.9 g/dL (ref 3.5–5.2)
Alkaline Phosphatase: 79 U/L (ref 39–117)
BUN: 11 mg/dL (ref 6–23)
CO2: 31 meq/L (ref 19–32)
Calcium: 8.9 mg/dL (ref 8.4–10.5)
Chloride: 100 meq/L (ref 96–112)
Creatinine, Ser: 0.94 mg/dL (ref 0.40–1.50)
GFR: 93.05 mL/min (ref >60.00)
Glucose, Bld: 174 mg/dL — ABNORMAL HIGH (ref 70–99)
Potassium: 4.3 meq/L (ref 3.5–5.1)
Sodium: 139 meq/L (ref 135–145)
Total Bilirubin: 0.6 mg/dL (ref 0.2–1.2)
Total Protein: 6.1 g/dL (ref 6.0–8.3)

## 2024-05-26 LAB — HEMOGLOBIN A1C: Hgb A1c MFr Bld: 7.9 % — ABNORMAL HIGH (ref 4.6–6.5)

## 2024-05-26 LAB — URIC ACID: Uric Acid, Serum: 7.4 mg/dL (ref 4.0–7.8)

## 2024-05-26 LAB — PSA: PSA: 0.59 ng/mL (ref 0.10–4.00)

## 2024-05-29 ENCOUNTER — Ambulatory Visit: Payer: Self-pay | Admitting: Family Medicine

## 2024-06-09 ENCOUNTER — Encounter: Payer: Self-pay | Admitting: Family Medicine

## 2024-06-09 ENCOUNTER — Ambulatory Visit: Admitting: Family Medicine

## 2024-06-09 VITALS — BP 142/100 | HR 73 | Temp 97.9°F | Ht 68.5 in | Wt 217.0 lb

## 2024-06-09 DIAGNOSIS — Z23 Encounter for immunization: Secondary | ICD-10-CM

## 2024-06-09 DIAGNOSIS — E119 Type 2 diabetes mellitus without complications: Secondary | ICD-10-CM

## 2024-06-09 DIAGNOSIS — Z0000 Encounter for general adult medical examination without abnormal findings: Secondary | ICD-10-CM

## 2024-06-09 DIAGNOSIS — Z7189 Other specified counseling: Secondary | ICD-10-CM

## 2024-06-09 DIAGNOSIS — E781 Pure hyperglyceridemia: Secondary | ICD-10-CM

## 2024-06-09 DIAGNOSIS — J069 Acute upper respiratory infection, unspecified: Secondary | ICD-10-CM

## 2024-06-09 MED ORDER — LISINOPRIL 2.5 MG PO TABS: ORAL_TABLET | ORAL | 3 refills | Status: DC

## 2024-06-09 MED ORDER — PRAVASTATIN SODIUM 10 MG PO TABS: ORAL_TABLET | ORAL | 3 refills | Status: AC

## 2024-06-09 MED ORDER — METFORMIN HCL 500 MG PO TABS: 1000.0000 mg | ORAL_TABLET | Freq: Two times a day (BID) | ORAL | 3 refills | Status: AC

## 2024-06-09 NOTE — Assessment & Plan Note (Signed)
 Continue work on diet and exercise.  Recheck A1c in 3 months.  No change in meds yet.  Continue metformin .    He can update me if BP remains elevated after URI resolved.

## 2024-06-09 NOTE — Assessment & Plan Note (Signed)
Continue work on diet and exercise.  Continue pravastatin. 

## 2024-06-09 NOTE — Assessment & Plan Note (Signed)
 Benign exam, supportive care. Mild sx.  Update me as needed.

## 2024-06-09 NOTE — Assessment & Plan Note (Signed)
 Tetanus 2020 Flu 2025 PNA up to date Shingles d/w pt.   covid vaccine d/w pt  HIV screening prev done.  PSA 2025 Colonoscopy 2023 Living will d/w pt.  Wife designated if patient were incapacitated.   Diet and exercise d/w pt.

## 2024-06-09 NOTE — Patient Instructions (Signed)
 Don't change your meds for now.  If your BP stays above 140/90 when feeling well, then let me know.  Recheck A1c in about 3 months at a visit.  Take care.  Glad to see you.

## 2024-06-09 NOTE — Assessment & Plan Note (Signed)
 Living will d/w pt.  Wife designated if patient were incapacitated.   ?

## 2024-06-09 NOTE — Progress Notes (Signed)
 CPE- See plan.  Routine anticipatory guidance given to patient.  See health maintenance.  The possibility exists that previously documented standard health maintenance information may have been brought forward from a previous encounter into this note.  If needed, that same information has been updated to reflect the current situation based on today's encounter.    Tetanus 2020 Flu 2025 PNA up to date Shingles d/w pt.   covid vaccine d/w pt  HIV screening prev done.  PSA 2025 Colonoscopy 2023 Living will d/w pt.  Wife designated if patient were incapacitated.   Diet and exercise d/w pt.   Recent URI, started about 2 days ago.  Post nasal gtt and sinus pressure.  No fevers.  Minimal cough today.  He took advil  cold and sinus but not today.  BP elevated today.   Elevated Cholesterol: Using medications without problems: yes Muscle aches: not likely from statin.  Diet compliance: d/w pt.  Exercise: d/w pt.  Labs d/w pt.   Diabetes:  Using medications without difficulties: yes Hypoglycemic episodes:no Hyperglycemic episodes:no Feet problems:no Blood Sugars averaging: 125-135.   eye exam within last year: yes A1c higher, dw pt at OV.  Exercise fell off this fall, d/w pt.  His schedule was hectic.    Gout hx. No recent gout flares.    PMH and SH reviewed  Meds, vitals, and allergies reviewed.   ROS: Per HPI.  Unless specifically indicated otherwise in HPI, the patient denies:  General: fever. Eyes: acute vision changes ENT: sore throat Cardiovascular: chest pain Respiratory: SOB GI: vomiting GU: dysuria Musculoskeletal: acute back pain Derm: acute rash Neuro: acute motor dysfunction Psych: worsening mood Endocrine: polydipsia Heme: bleeding Allergy: hayfever  GEN: nad, alert and oriented HEENT: mucous membranes moist, TM wnl, mild rhinorrhea.  OP wnl NECK: supple w/o LA CV: rrr. PULM: ctab, no inc wob ABD: soft, +bs, soft umbilical hernia.  EXT: no edema SKIN: no  acute rash  Diabetic foot exam: Normal inspection No skin breakdown No calluses  Normal DP pulses Normal sensation to light touch and monofilament Nails normal

## 2024-06-16 ENCOUNTER — Encounter: Payer: Self-pay | Admitting: Family Medicine

## 2024-06-20 ENCOUNTER — Other Ambulatory Visit: Payer: Self-pay | Admitting: Family Medicine

## 2024-06-20 MED ORDER — LISINOPRIL 2.5 MG PO TABS: 5.0000 mg | ORAL_TABLET | Freq: Every day | ORAL | Status: DC

## 2024-07-24 ENCOUNTER — Other Ambulatory Visit: Payer: Self-pay | Admitting: Family Medicine

## 2024-07-24 MED ORDER — LISINOPRIL 10 MG PO TABS: 10.0000 mg | ORAL_TABLET | Freq: Every day | ORAL | 1 refills | Status: AC

## 2024-09-08 ENCOUNTER — Ambulatory Visit: Admitting: Family Medicine
